# Patient Record
Sex: Female | Born: 1950 | Race: White | Hispanic: No | Marital: Single | Smoking: Never smoker
Health system: Southern US, Community
[De-identification: ages and names within clinical notes are randomized; demographics above are authoritative.]

## PROBLEM LIST (undated history)

## (undated) DIAGNOSIS — I10 Essential (primary) hypertension: Secondary | ICD-10-CM

## (undated) DIAGNOSIS — E669 Obesity, unspecified: Secondary | ICD-10-CM

## (undated) DIAGNOSIS — L309 Dermatitis, unspecified: Secondary | ICD-10-CM

## (undated) DIAGNOSIS — M1990 Unspecified osteoarthritis, unspecified site: Secondary | ICD-10-CM

## (undated) HISTORY — DX: Obesity, unspecified: E66.9

## (undated) HISTORY — DX: Essential (primary) hypertension: I10

## (undated) HISTORY — DX: Dermatitis, unspecified: L30.9

## (undated) HISTORY — DX: Unspecified osteoarthritis, unspecified site: M19.90

---

## 1983-07-20 HISTORY — PX: MYOMECTOMY: SHX85

## 1993-07-19 HISTORY — PX: TOTAL ABDOMINAL HYSTERECTOMY: SHX209

## 1996-07-19 HISTORY — PX: ANAL FISSURE REPAIR: SHX2312

## 1998-01-14 ENCOUNTER — Other Ambulatory Visit: Admission: RE | Admit: 1998-01-14 | Discharge: 1998-01-14 | Payer: Self-pay | Admitting: *Deleted

## 1998-07-14 ENCOUNTER — Ambulatory Visit (HOSPITAL_COMMUNITY): Admission: RE | Admit: 1998-07-14 | Discharge: 1998-07-14 | Payer: Self-pay | Admitting: General Surgery

## 1999-01-21 ENCOUNTER — Other Ambulatory Visit: Admission: RE | Admit: 1999-01-21 | Discharge: 1999-01-21 | Payer: Self-pay | Admitting: *Deleted

## 2001-06-19 ENCOUNTER — Other Ambulatory Visit: Admission: RE | Admit: 2001-06-19 | Discharge: 2001-06-19 | Payer: Self-pay | Admitting: *Deleted

## 2004-01-29 LAB — HM COLONOSCOPY

## 2005-02-09 ENCOUNTER — Ambulatory Visit: Payer: Self-pay | Admitting: Internal Medicine

## 2005-02-17 ENCOUNTER — Ambulatory Visit: Payer: Self-pay | Admitting: Internal Medicine

## 2005-03-23 ENCOUNTER — Ambulatory Visit: Payer: Self-pay | Admitting: Internal Medicine

## 2006-06-13 ENCOUNTER — Ambulatory Visit: Payer: Self-pay | Admitting: Internal Medicine

## 2006-06-13 LAB — CONVERTED CEMR LAB
Bilirubin Urine: NEGATIVE
CO2: 29 meq/L (ref 19–32)
Chloride: 99 meq/L (ref 96–112)
Creatinine, Ser: 0.9 mg/dL (ref 0.4–1.2)
GFR calc non Af Amer: 69 mL/min
Glomerular Filtration Rate, Af Am: 83 mL/min/{1.73_m2}
Glucose, Bld: 98 mg/dL (ref 70–99)
Ketones, ur: NEGATIVE mg/dL
LDL Cholesterol: 75 mg/dL (ref 0–99)
Leukocytes, UA: NEGATIVE
Lymphocytes Relative: 26.1 % (ref 12.0–46.0)
MCHC: 35.7 g/dL (ref 30.0–36.0)
MCV: 89.5 fL (ref 78.0–100.0)
Monocytes Absolute: 0.3 10*3/uL (ref 0.2–0.7)
Neutro Abs: 4.5 10*3/uL (ref 1.4–7.7)
Platelets: 318 10*3/uL (ref 150–400)
RBC: 3.88 M/uL (ref 3.87–5.11)
Sodium: 138 meq/L (ref 135–145)
Specific Gravity, Urine: 1.01 (ref 1.000–1.03)
TSH: 2.04 microintl units/mL (ref 0.35–5.50)
Total Protein, Urine: NEGATIVE mg/dL
Triglyceride fasting, serum: 141 mg/dL (ref 0–149)
Urine Glucose: NEGATIVE mg/dL
VLDL: 28 mg/dL (ref 0–40)
pH: 5.5 (ref 5.0–8.0)

## 2006-06-16 ENCOUNTER — Ambulatory Visit: Payer: Self-pay | Admitting: Internal Medicine

## 2007-06-20 ENCOUNTER — Encounter: Payer: Self-pay | Admitting: Internal Medicine

## 2007-06-20 DIAGNOSIS — Z9079 Acquired absence of other genital organ(s): Secondary | ICD-10-CM | POA: Insufficient documentation

## 2007-06-20 DIAGNOSIS — Z872 Personal history of diseases of the skin and subcutaneous tissue: Secondary | ICD-10-CM | POA: Insufficient documentation

## 2007-06-20 DIAGNOSIS — I10 Essential (primary) hypertension: Secondary | ICD-10-CM | POA: Insufficient documentation

## 2007-06-20 DIAGNOSIS — E669 Obesity, unspecified: Secondary | ICD-10-CM | POA: Insufficient documentation

## 2007-06-21 ENCOUNTER — Ambulatory Visit: Payer: Self-pay | Admitting: Internal Medicine

## 2007-06-21 LAB — CONVERTED CEMR LAB
BUN: 20 mg/dL (ref 6–23)
Basophils Absolute: 0.1 10*3/uL (ref 0.0–0.1)
Basophils Relative: 1.2 % — ABNORMAL HIGH (ref 0.0–1.0)
CO2: 30 meq/L (ref 19–32)
Calcium: 10 mg/dL (ref 8.4–10.5)
GFR calc Af Amer: 83 mL/min
Lymphocytes Relative: 26.5 % (ref 12.0–46.0)
MCHC: 35.4 g/dL (ref 30.0–36.0)
Monocytes Absolute: 0.4 10*3/uL (ref 0.2–0.7)
Monocytes Relative: 5.1 % (ref 3.0–11.0)
Neutro Abs: 5.1 10*3/uL (ref 1.4–7.7)
Platelets: 323 10*3/uL (ref 150–400)
Potassium: 3.7 meq/L (ref 3.5–5.1)
RDW: 16.1 % — ABNORMAL HIGH (ref 11.5–14.6)

## 2007-06-23 ENCOUNTER — Encounter: Payer: Self-pay | Admitting: Internal Medicine

## 2008-01-22 ENCOUNTER — Encounter: Payer: Self-pay | Admitting: Internal Medicine

## 2008-01-22 LAB — HM MAMMOGRAPHY: HM Mammogram: NORMAL

## 2008-02-21 ENCOUNTER — Ambulatory Visit: Payer: Self-pay | Admitting: Internal Medicine

## 2008-02-22 DIAGNOSIS — H8110 Benign paroxysmal vertigo, unspecified ear: Secondary | ICD-10-CM

## 2008-07-19 HISTORY — PX: ARTHROSCOPIC REPAIR ACL: SUR80

## 2009-01-24 ENCOUNTER — Encounter: Payer: Self-pay | Admitting: Internal Medicine

## 2009-01-27 ENCOUNTER — Ambulatory Visit: Payer: Self-pay | Admitting: Internal Medicine

## 2009-01-29 ENCOUNTER — Telehealth: Payer: Self-pay | Admitting: Internal Medicine

## 2009-01-29 ENCOUNTER — Ambulatory Visit: Payer: Self-pay | Admitting: Internal Medicine

## 2009-01-29 LAB — CONVERTED CEMR LAB
BUN: 18 mg/dL (ref 6–23)
Basophils Absolute: 0.1 10*3/uL (ref 0.0–0.1)
Basophils Relative: 1.1 % (ref 0.0–3.0)
Chloride: 101 meq/L (ref 96–112)
Cholesterol: 178 mg/dL (ref 0–200)
Eosinophils Absolute: 0.2 10*3/uL (ref 0.0–0.7)
GFR calc non Af Amer: 67.93 mL/min (ref >60)
HDL: 56.1 mg/dL (ref >39.00)
LDL Cholesterol: 97 mg/dL (ref 0–99)
Lymphocytes Relative: 31.1 % (ref 12.0–46.0)
MCHC: 35.9 g/dL (ref 30.0–36.0)
MCV: 92.2 fL (ref 78.0–100.0)
Monocytes Absolute: 0.4 10*3/uL (ref 0.1–1.0)
Neutrophils Relative %: 57.5 % (ref 43.0–77.0)
Platelets: 265 10*3/uL (ref 150.0–400.0)
Potassium: 3.8 meq/L (ref 3.5–5.1)
RDW: 15.8 % — ABNORMAL HIGH (ref 11.5–14.6)
Sodium: 142 meq/L (ref 135–145)
Triglycerides: 127 mg/dL (ref 0.0–149.0)
VLDL: 25.4 mg/dL (ref 0.0–40.0)

## 2009-02-01 ENCOUNTER — Encounter: Payer: Self-pay | Admitting: Internal Medicine

## 2010-01-27 ENCOUNTER — Encounter: Payer: Self-pay | Admitting: Internal Medicine

## 2010-12-04 NOTE — Assessment & Plan Note (Signed)
[facility]                           PRIMARY CARE OFFICE NOTE   NAME:[name], [name]                    MRN:          [id number]  DATE:06/16/2006                            DOB:          12/15/1950    [name] [name] is a 60 year old Caucasian woman who presents for followup  evaluation and exam.  She was last seen for a physical February 17, 2005.  She was seen in the office March 23, 2005, for shingles in the left  T12 distribution treated with Valtrex and Lidoderm patch.  She made a  good recovery from that and in the interval has done well.  She does  report she is having some problems with allergic rhinitis which started  with an upper respiratory infection.  She is treating this with over-the-  counter medications and does not wish prescription drugs at this time.   PAST MEDICAL HISTORY:  Surgical:  1. TAH/BSO in 1995.  2. Repair of anal fissure in 1998.   Medical:  1. Usual childhood diseases.  2. Obesity.  3. Hypertension.   CURRENT MEDICATIONS:  1. Lisinopril 20 mg daily.  2. Premarin 0.3 mg daily.  3. Hydrochlorothiazide 25 mg daily.  4. Aspirin 325 mg daily.  5. Triamcinolone 0.1% cream p.r.n.   PHYSICIAN ROSTER:  1. Dr. Earlene Plater for gynecology.  2. Dr. Arlyce Dice for GI.  3. Dr. Kendrick Ranch for surgery.   HEALTH MAINTENANCE:  Last Pap smear August 2007.  Last mammogram May  2007.  Last colonoscopy was in 2005.  The patient has declined DT and  influenza vaccine.   FAMILY HISTORY:  Is previously well documented.   SOCIAL HISTORY:  The patient continues to work as a Lawyer.  She remains single but active in her community and she is a self-  described homebody.   REVIEW OF SYSTEMS:  Significant for a 6-pound weight gain in the last  year but no fevers, chills or other problems.  Last eye exam greater  than 24 months.  No ENT, cardiovascular, or respiratory problems.  Occasional heartburn is reported, treated with OTC H2  blockers.  No GU,  musculoskeletal, or dermatologic complaints.   EXAMINATION:  VITAL SIGNS:  Temperature was 99.4, blood pressure 137/75,  pulse 87, weight 236, height is 5 feet 6 inches.  GENERAL APPEARANCE:  This is an overweight Caucasian woman who looks her  stated age, in no acute distress.  HEENT:  Normocephalic, atraumatic.  EACs and TMs were normal.  Oropharynx with native dentition in good repair.  No buccal or palatal  lesions were noted.  Posterior pharynx was clear.  Conjunctivae and  sclerae were clear.  PERRLA, EOMI.  Funduscopic exam was unremarkable.  NECK:  Supple without thyromegaly.  NODES:  No adenopathy was noted in the cervical, supraclavicular  regions.  CHEST:  No CVA tenderness.  LUNGS:  Clear with no rales, wheezes, or rhonchi.  BREAST:  Exam deferred to gynecology.  CARDIOVASCULAR:  Shows 2+ radial pulse, no JVD or carotid bruits.  She  had a quiet precordium with a regular rate and rhythm, without murmurs,  rubs, or gallops.  ABDOMEN:  Obese, soft.  No guarding or rebound.  No organosplenomegaly  was noted.  PELVIC AND RECTAL:  Deferred.  EXTREMITIES:  Without clubbing, cyanosis, edema, or deformity.  NEUROLOGIC:  Nonfocal.  SKIN:  Clear.   DATABASE:  Hemoglobin was 12.4 g, white count 6700 with a normal  differential.  Chemistries were unremarkable with a serum glucose of 98.  Liver functions, kidney functions were normal.  Cholesterol 167,  triglycerides 141, HDL 64.3, LDL 75.  Urinalysis was negative.   ASSESSMENT AND PLAN:  1. Hypertension.  The patient's blood pressure is adequately      controlled on low-dose hydrochlorothiazide and lisinopril.  Will      continue the same.  2. Health maintenance.  The patient is current and up-to-date with GYN      and colorectal cancer screening.   We did discuss weight management and I have encouraged her to keep a  food diary for 5 days with a calorie analysis.  I have encouraged her to  make smart food  choices and to reduce her portion size.  Ideal calorie  intake should be 1200-1300 calories per day.  Goal is to lose down to  200 pounds and the time frame would be 24-36 months.   In summary, a very pleasant person who seems to be medically stable.  I  would like to see her back in 1 year for followup or on a p.r.n. basis.     [name] Gess Norins, MD  Electronically Signed   MEN/MedQ  DD: 06/16/2006  DT: 06/17/2006  Job #: [id number]   cc:   [facility]

## 2011-01-25 ENCOUNTER — Telehealth: Payer: Self-pay | Admitting: *Deleted

## 2011-01-25 NOTE — Telephone Encounter (Signed)
Patient requesting RX RF before 7/26 OV. Unsure med

## 2011-01-27 NOTE — Telephone Encounter (Signed)
Left mess to call office back with details.

## 2011-01-29 MED ORDER — LISINOPRIL 20 MG PO TABS: 20.0000 mg | ORAL_TABLET | Freq: Every day | ORAL | Status: DC

## 2011-01-29 MED ORDER — HYDROCHLOROTHIAZIDE 25 MG PO TABS: 25.0000 mg | ORAL_TABLET | Freq: Every day | ORAL | Status: DC

## 2011-01-31 ENCOUNTER — Encounter: Payer: Self-pay | Admitting: Internal Medicine

## 2011-02-11 ENCOUNTER — Other Ambulatory Visit (INDEPENDENT_AMBULATORY_CARE_PROVIDER_SITE_OTHER): Payer: BC Managed Care – PPO

## 2011-02-11 ENCOUNTER — Ambulatory Visit (INDEPENDENT_AMBULATORY_CARE_PROVIDER_SITE_OTHER): Payer: BC Managed Care – PPO | Admitting: Internal Medicine

## 2011-02-11 ENCOUNTER — Encounter: Payer: Self-pay | Admitting: Internal Medicine

## 2011-02-11 VITALS — BP 138/82 | HR 88 | Temp 98.0°F | Wt 242.0 lb

## 2011-02-11 DIAGNOSIS — E669 Obesity, unspecified: Secondary | ICD-10-CM

## 2011-02-11 DIAGNOSIS — Z0000 Encounter for general adult medical examination without abnormal findings: Secondary | ICD-10-CM

## 2011-02-11 DIAGNOSIS — Z23 Encounter for immunization: Secondary | ICD-10-CM

## 2011-02-11 DIAGNOSIS — I10 Essential (primary) hypertension: Secondary | ICD-10-CM

## 2011-02-11 LAB — CBC WITH DIFFERENTIAL/PLATELET
Basophils Absolute: 0.1 10*3/uL (ref 0.0–0.1)
Eosinophils Relative: 5.3 % — ABNORMAL HIGH (ref 0.0–5.0)
HCT: 35.2 % — ABNORMAL LOW (ref 36.0–46.0)
Hemoglobin: 12.4 g/dL (ref 12.0–15.0)
Lymphocytes Relative: 22.2 % (ref 12.0–46.0)
Lymphs Abs: 1.6 10*3/uL (ref 0.7–4.0)
Monocytes Relative: 5.1 % (ref 3.0–12.0)
Platelets: 266 10*3/uL (ref 150.0–400.0)
RDW: 15.4 % — ABNORMAL HIGH (ref 11.5–14.6)
WBC: 7.3 10*3/uL (ref 4.5–10.5)

## 2011-02-11 LAB — HEPATIC FUNCTION PANEL
ALT: 39 U/L — ABNORMAL HIGH (ref 0–35)
AST: 35 U/L (ref 0–37)
Alkaline Phosphatase: 55 U/L (ref 39–117)
Bilirubin, Direct: 0.1 mg/dL (ref 0.0–0.3)
Total Bilirubin: 0.9 mg/dL (ref 0.3–1.2)
Total Protein: 8.2 g/dL (ref 6.0–8.3)

## 2011-02-11 LAB — COMPREHENSIVE METABOLIC PANEL
ALT: 39 U/L — ABNORMAL HIGH (ref 0–35)
AST: 35 U/L (ref 0–37)
Alkaline Phosphatase: 55 U/L (ref 39–117)
Calcium: 9.7 mg/dL (ref 8.4–10.5)
Chloride: 99 mEq/L (ref 96–112)
Creatinine, Ser: 1.1 mg/dL (ref 0.4–1.2)

## 2011-02-11 LAB — LIPID PANEL
Cholesterol: 186 mg/dL (ref 0–200)
HDL: 67 mg/dL (ref >39.00)
LDL Cholesterol: 90 mg/dL (ref 0–99)
VLDL: 29 mg/dL (ref 0.0–40.0)

## 2011-02-11 MED ORDER — TETANUS-DIPHTH-ACELL PERTUSSIS 5-2.5-18.5 LF-MCG/0.5 IM SUSP: 0.5000 mL | Freq: Once | INTRAMUSCULAR | Status: DC

## 2011-02-11 MED ORDER — TRIAMCINOLONE ACETONIDE 0.1 % EX CREA: 1.0000 "application " | TOPICAL_CREAM | CUTANEOUS | Status: DC | PRN

## 2011-02-11 MED ORDER — LISINOPRIL 20 MG PO TABS: 20.0000 mg | ORAL_TABLET | Freq: Every day | ORAL | Status: DC

## 2011-02-11 MED ORDER — PNEUMOCOCCAL VAC POLYVALENT 25 MCG/0.5ML IJ INJ: 0.5000 mL | INJECTION | Freq: Once | INTRAMUSCULAR | Status: DC

## 2011-02-11 MED ORDER — HYDROCHLOROTHIAZIDE 25 MG PO TABS: 25.0000 mg | ORAL_TABLET | Freq: Every day | ORAL | Status: DC

## 2011-02-11 NOTE — Progress Notes (Signed)
Subjective:    Patient ID: [name] [name], female    DOB: May 12, 1951, 60 y.o.   MRN: [id number]  HPI Mrs.Ching was last seen in July '10. In the interval she has had arthroscopic surgery right knee for a torn miniscus and debridement. She is s/p hysterectomy and does not see gyn. She had a mammogram last Thursday and had a liquid cyst and she will have recheck in 6 months. She has been doing well otherwise.   Past Medical History  Diagnosis Date  . Obesity   . Hypertension    Past Surgical History  Procedure Date  . Anal fissure repair 1998  . Total abdominal hysterectomy 1995   Family History  Problem Relation Age of Onset  . Coronary artery disease Mother     CABG  . Hypertension Mother   . Hyperlipidemia Mother   . Lupus Mother   . COPD Father   . Cancer Neg Hx     breast or colon   History   Social History  . Marital Status: Single    Spouse Name: N/A    Number of Children: N/A  . Years of Education: N/A   Occupational History  . 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 Smoking status: Never Smoker   . Smokeless tobacco: Never Used  . Alcohol Use: No  . Drug Use: No  . Sexually Active: Not Currently   Other Topics Concern  . Not on file   Social History Narrative   [employer], . Single, has own home. 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 End of Life care: yes for CPR and short term mechanical ventilation but no long term artificial support.        Review of Systems Review of Systems  Constitutional:  Negative for fever, chills, activity change and unexpected weight change.  HEENT:  Negative for hearing loss, ear pain, congestion, neck stiffness and postnasal drip. Negative for sore throat or swallowing problems. Negative for dental complaints.   Eyes: Negative for vision loss or change in visual acuity.  Respiratory: Negative for chest tightness and wheezing.   Cardiovascular: Negative for chest pain and  palpitation. No decreased exercise tolerance Gastrointestinal: No change in bowel habit. No bloating or gas. No reflux or indigestion Genitourinary: Negative for urgency, frequency, flank pain and difficulty urinating.  Musculoskeletal: Negative for myalgias, back pain, arthralgias and gait problem except for left shoulder discomfort.   Neurological: Negative for dizziness, tremors, weakness and headaches.  Hematological: Negative for adenopathy.  Psychiatric/Behavioral: Negative for behavioral problems and dysphoric mood.       Objective:   Physical Exam Vitals reviewed: weight noted Gen'l: well nourished, well developed white woman in no distress HEENT - [place]/AT, EACs/TMs normal, oropharynx with upper denture and remaining native teeth mandible, no buccal or palatal lesions, posterior pharynx clear, mucous membranes moist. C&S clear, PERRLA, fundi - normal Neck - supple, no thyromegaly Nodes- negative submental, cervical, supraclavicular regions Chest - no deformity, no CVAT Lungs - clear without rales, wheezes. No increased work of breathing Breast - deferred to recent mammogram Cardiovascular - regular rate and rhythm, quiet precordium, no murmurs, rubs or gallops, 2+ radial, DP and PT pulses Abdomen - BS+ x 4, no HSM, no guarding or rebound or tenderness Pelvic - deferred s/p hysterectomy Rectal - deferred to GI Extremities - no clubbing, cyanosis, edema or deformity.  Neuro - A&O x 3, CN II-XII normal, motor  strength normal and equal, DTRs 2+ and symmetrical biceps, radial, and patellar tendons. Cerebellar - no tremor, no rigidity, fluid movement and normal gait. Derm - Head, neck, back, abdomen and extremities without suspicious lesions  Lab Results  Component Value Date   WBC 7.3 02/11/2011   HGB 12.4 02/11/2011   HCT 35.2* 02/11/2011   PLT 266.0 02/11/2011   CHOL 186 02/11/2011   TRIG 145.0 02/11/2011   HDL 67.00 02/11/2011   ALT 39* 02/11/2011   ALT 39* 02/11/2011   AST 35  02/11/2011   AST 35 02/11/2011   NA 136 02/11/2011   K 3.9 02/11/2011   CL 99 02/11/2011   CREATININE 1.1 02/11/2011   BUN 31* 02/11/2011   CO2 26 02/11/2011   TSH 1.65 02/11/2011   Lab Results  Component Value Date   LDLCALC 90 02/11/2011           Assessment & Plan:

## 2011-02-12 ENCOUNTER — Encounter: Payer: Self-pay | Admitting: Internal Medicine

## 2011-02-12 DIAGNOSIS — Z0001 Encounter for general adult medical examination with abnormal findings: Secondary | ICD-10-CM | POA: Insufficient documentation

## 2011-02-12 NOTE — Assessment & Plan Note (Signed)
BP Readings from Last 3 Encounters:  02/11/11 138/82  01/27/09 118/64  02/21/08 129/75   Good control  Plan - continue present medication

## 2011-02-12 NOTE — Assessment & Plan Note (Signed)
This remains a major health risk.   Plan - continued efforts for weight management: smart food choices (low cal low fat), portion size control - use the hand as a guide, regular exercise. Target weight 180 lbs; goal is to loose 1 lb per month.

## 2011-02-12 NOTE — Assessment & Plan Note (Signed)
Interval history is benign. Physical exam is normal, sans breast and pelvic, except for weight. Lab results with minimal non-pathologic elevation in liver functions otherwise normal range. She is current with colorectal cancer screen with last study Juy '05. Immunizations: Tdap today; researching last pneumonia vaccine; she has had shingles in the past - not a candidate for shingles vaccine. Current with breast cancer screening.  In summary - a nice woman who appears medically stable.She will return as needed or in 1 year for follow-up.

## 2011-02-13 NOTE — Progress Notes (Signed)
Mailed/SD 

## 2011-02-17 ENCOUNTER — Encounter: Payer: Self-pay | Admitting: Internal Medicine

## 2011-03-16 ENCOUNTER — Encounter: Payer: Self-pay | Admitting: Gastroenterology

## 2011-07-20 HISTORY — PX: DIAGNOSTIC MAMMOGRAM: HXRAD719

## 2011-08-17 ENCOUNTER — Encounter: Payer: Self-pay | Admitting: Internal Medicine

## 2012-02-25 ENCOUNTER — Encounter: Payer: Self-pay | Admitting: Internal Medicine

## 2012-03-21 ENCOUNTER — Other Ambulatory Visit: Payer: Self-pay | Admitting: Internal Medicine

## 2012-07-11 ENCOUNTER — Other Ambulatory Visit: Payer: Self-pay | Admitting: Internal Medicine

## 2012-07-13 ENCOUNTER — Other Ambulatory Visit: Payer: Self-pay | Admitting: *Deleted

## 2012-07-13 MED ORDER — LISINOPRIL 20 MG PO TABS: 20.0000 mg | ORAL_TABLET | Freq: Every day | ORAL | Status: DC

## 2012-07-13 MED ORDER — HYDROCHLOROTHIAZIDE 25 MG PO TABS: 25.0000 mg | ORAL_TABLET | Freq: Every day | ORAL | Status: DC

## 2012-07-18 ENCOUNTER — Other Ambulatory Visit (INDEPENDENT_AMBULATORY_CARE_PROVIDER_SITE_OTHER): Payer: BC Managed Care – PPO

## 2012-07-18 ENCOUNTER — Ambulatory Visit (INDEPENDENT_AMBULATORY_CARE_PROVIDER_SITE_OTHER): Payer: BC Managed Care – PPO | Admitting: Internal Medicine

## 2012-07-18 ENCOUNTER — Encounter: Payer: Self-pay | Admitting: Internal Medicine

## 2012-07-18 VITALS — BP 140/84 | HR 97 | Temp 98.0°F | Resp 12 | Ht 69.0 in | Wt 250.0 lb

## 2012-07-18 DIAGNOSIS — I10 Essential (primary) hypertension: Secondary | ICD-10-CM

## 2012-07-18 DIAGNOSIS — Z0000 Encounter for general adult medical examination without abnormal findings: Secondary | ICD-10-CM

## 2012-07-18 DIAGNOSIS — Z23 Encounter for immunization: Secondary | ICD-10-CM

## 2012-07-18 DIAGNOSIS — E669 Obesity, unspecified: Secondary | ICD-10-CM

## 2012-07-18 LAB — COMPREHENSIVE METABOLIC PANEL
ALT: 45 U/L — ABNORMAL HIGH (ref 0–35)
CO2: 26 mEq/L (ref 19–32)
Calcium: 9.6 mg/dL (ref 8.4–10.5)
Chloride: 100 mEq/L (ref 96–112)
Creatinine, Ser: 1.2 mg/dL (ref 0.4–1.2)
GFR: 48.19 mL/min — ABNORMAL LOW (ref >60.00)
Glucose, Bld: 96 mg/dL (ref 70–99)
Total Bilirubin: 1 mg/dL (ref 0.3–1.2)
Total Protein: 7.7 g/dL (ref 6.0–8.3)

## 2012-07-18 NOTE — Patient Instructions (Addendum)
Thanks for coming to see me.  Your exam is ok except for the weight issue. Remember the principles of weight management: smart food choices, PORTION SIZE CONTROL, regular exercise. Goal is to loose 1-2 lbs/month to reach your target weight ( a long term project with rewards along the way.)  You will receive a copy of my full office note in several days that will include your labs as well as using MyChart  Have a Happy Healthy New Year.

## 2012-07-18 NOTE — Progress Notes (Signed)
Subjective:    Patient ID: [name] [name], female    DOB: 28-Oct-1950, 61 y.o.   MRN: [id number]  HPI Mr. Sprankle presents for annual wellness exam. She has been doing well except for chronic knee problems and stiffness; some discomfort in the right shoulder. No major illness or surgery. She has had some "heart flutters," w/o pain, SOB or discomfort. She has full dentures and doesn't need dental care. She is overdue for ophthal evaluation. She is aware of the need to address her weight.   Past Medical History  Diagnosis Date  . Obesity   . Hypertension    Past Surgical History  Procedure Date  . Anal fissure repair 1998  . Total abdominal hysterectomy 1995  . Arthroscopic repair acl 2010  . Diagnostic mammogram 2013   Family History  Problem Relation Age of Onset  . Coronary artery disease Mother     CABG  . Hypertension Mother   . Hyperlipidemia Mother   . Lupus Mother   . COPD Father   . Cancer Neg Hx     breast or colon   History   Social History  . Marital Status: Single    Spouse Name: N/A    Number of Children: N/A  . Years of Education: N/A   Occupational History  . 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 Smoking status: Never Smoker   . Smokeless tobacco: Never Used  . Alcohol Use: No  . Drug Use: No  . Sexually Active: Not Currently   Other Topics Concern  . Not on file   Social History Narrative   [employer], . Single, has own home. 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 End of Life care: yes for CPR and short term mechanical ventilation but no long term artificial support.     Current Outpatient Prescriptions on File Prior to Visit  Medication Sig Dispense Refill  . aspirin 81 MG tablet Take 81 mg by mouth daily.        . hydrochlorothiazide (HYDRODIURIL) 25 MG tablet Take 1 tablet (25 mg total) by mouth daily.  90 tablet  1  . lisinopril (PRINIVIL,ZESTRIL) 20 MG tablet Take 1 tablet (20 mg total) by  mouth daily.  90 tablet  1  . triamcinolone (KENALOG) 0.1 % cream Apply 1 application topically as needed.  90 g  1   Current Facility-Administered Medications on File Prior to Visit  Medication Dose Route Frequency Provider Last Rate Last Dose  . pneumococcal 23 valent vaccine (PNU-IMMUNE) injection 0.5 mL  0.5 mL Intramuscular Once Jacques Navy, MD      . TDaP (BOOSTRIX) injection 0.5 mL  0.5 mL Intramuscular Once Jacques Navy, MD          Review of Systems Constitutional:  Negative for fever, chills, activity change and unexpected weight change.  HEENT:  Negative for hearing loss, ear pain, congestion, neck stiffness and postnasal drip. Negative for sore throat or swallowing problems. Negative for dental complaints.   Eyes: Negative for vision loss or change in visual acuity.  Respiratory: Negative for chest tightness and wheezing. Negative for DOE.   Cardiovascular: Negative for chest pain. She did have palpitations. No decreased exercise tolerance Gastrointestinal: No change in bowel habit. No bloating or gas. No reflux or indigestion Genitourinary: Negative for urgency, frequency, flank pain and difficulty urinating.  Musculoskeletal: Negative for myalgias, back pain, and gait problem. Has arthralgia both  knees and right shoulder Neurological: Negative for dizziness, tremors, weakness and headaches.  Hematological: Negative for adenopathy.  Psychiatric/Behavioral: Negative for behavioral problems and dysphoric mood.       Objective:   Physical Exam Filed Vitals:   07/18/12 1005  BP: 140/84  Pulse: 97  Temp: 98 F (36.7 C)  Resp: 12   Wt Readings from Last 3 Encounters:  07/18/12 250 lb (113.399 kg)  02/11/11 242 lb (109.77 kg)  01/27/09 231 lb (104.781 kg)   Gen'l: well nourished, well developed white  Woman in no distress HEENT - [place]/AT, EACs/TMs normal, oropharynx with native dentition in good condition, no buccal or palatal lesions, posterior pharynx clear,  mucous membranes moist. C&S clear, PERRLA, fundi - normal Neck - supple, no thyromegaly Nodes- negative submental, cervical, supraclavicular regions Chest - no deformity, no CVAT Lungs - clear without rales, wheezes. No increased work of breathing Breast - deferred to recent mammogram Cardiovascular - regular rate and rhythm, quiet precordium, no murmurs, rubs or gallops, 2+ radial, DP and PT pulses Abdomen - BS+ x 4, no HSM, no guarding or rebound or tenderness Pelvic - deferred  Rectal - deferred  Extremities - no clubbing, cyanosis, edema or deformity.  Neuro - A&O x 3, CN II-XII normal, motor strength normal and equal, DTRs 2+ and symmetrical biceps, radial, and patellar tendons. Cerebellar - no tremor, no rigidity, fluid movement and normal gait. Derm - Head, neck, back, abdomen and extremities without suspicious lesions  Lab Results  Component Value Date   WBC 7.3 02/11/2011   HGB 12.4 02/11/2011   HCT 35.2* 02/11/2011   PLT 266.0 02/11/2011   GLUCOSE 96 07/18/2012   CHOL 186 02/11/2011   TRIG 145.0 02/11/2011   HDL 67.00 02/11/2011   LDLCALC 90 02/11/2011   ALT 45* 07/18/2012   AST 39* 07/18/2012   NA 136 07/18/2012   K 3.9 07/18/2012   CL 100 07/18/2012   CREATININE 1.2 07/18/2012   BUN 27* 07/18/2012   CO2 26 07/18/2012   TSH 1.65 02/11/2011          Assessment & Plan:

## 2012-07-19 NOTE — Assessment & Plan Note (Signed)
Reviewed the importance of weight management in regard to her overall health, including the problem of degenerative joint disease - knees. Discussed the principles of weight management: target weight 200 lbs, goal of loosing 1-2 lbs per month ( 2-4 year project).

## 2012-07-19 NOTE — Assessment & Plan Note (Signed)
Interval medical history is without major illness, surgery or injury. Phyiscal exam, sans breast and pelvic, is normal except for weight. Lab results are in normal range. She is current with colorectal and breast cancer screening. Immunizations - she is due for pneumonia and shingles vaccines.  In summary - a very nice woman who is doing OK. She will return in 1 year or sooner as needed.

## 2012-07-19 NOTE — Assessment & Plan Note (Signed)
BP Readings from Last 3 Encounters:  07/18/12 140/84  02/11/11 138/82  01/27/09 118/64   Lab results, renal function and electrolytes, are normal.  Plan Continue present medications  Weight management

## 2013-01-24 ENCOUNTER — Other Ambulatory Visit: Payer: Self-pay | Admitting: Internal Medicine

## 2013-02-16 ENCOUNTER — Encounter: Payer: Self-pay | Admitting: Internal Medicine

## 2013-08-20 ENCOUNTER — Other Ambulatory Visit: Payer: Self-pay | Admitting: Internal Medicine

## 2013-08-23 ENCOUNTER — Ambulatory Visit (INDEPENDENT_AMBULATORY_CARE_PROVIDER_SITE_OTHER): Payer: BC Managed Care – PPO | Admitting: Internal Medicine

## 2013-08-23 ENCOUNTER — Encounter: Payer: Self-pay | Admitting: Internal Medicine

## 2013-08-23 ENCOUNTER — Other Ambulatory Visit (INDEPENDENT_AMBULATORY_CARE_PROVIDER_SITE_OTHER): Payer: BC Managed Care – PPO

## 2013-08-23 VITALS — BP 160/90 | HR 84 | Temp 98.3°F | Wt 253.8 lb

## 2013-08-23 DIAGNOSIS — Z23 Encounter for immunization: Secondary | ICD-10-CM

## 2013-08-23 DIAGNOSIS — Z0000 Encounter for general adult medical examination without abnormal findings: Secondary | ICD-10-CM

## 2013-08-23 DIAGNOSIS — I10 Essential (primary) hypertension: Secondary | ICD-10-CM

## 2013-08-23 DIAGNOSIS — E669 Obesity, unspecified: Secondary | ICD-10-CM

## 2013-08-23 LAB — LIPID PANEL
CHOL/HDL RATIO: 3
Cholesterol: 174 mg/dL (ref 0–200)
HDL: 66.5 mg/dL (ref >39.00)
LDL CALC: 84 mg/dL (ref 0–99)
Triglycerides: 116 mg/dL (ref 0.0–149.0)
VLDL: 23.2 mg/dL (ref 0.0–40.0)

## 2013-08-23 LAB — HEMOGLOBIN AND HEMATOCRIT, BLOOD
HEMATOCRIT: 36.1 % (ref 36.0–46.0)
HEMOGLOBIN: 12.3 g/dL (ref 12.0–15.0)

## 2013-08-23 NOTE — Progress Notes (Signed)
Subjective:    Patient ID: [name] [name], female    DOB: 1950/09/19, 63 y.o.   MRN: [id number]  HPI Mrs. Ostermiller presents for an annual wellness exam. She is s/p TAH/BSO. She is due July '15 for colonoscopy. Discussed colonoscopy vs Colo-Guard screening - she is leaning toward colonoscopy but she will get back to [place]. She is current with mammography. She does continue to have knee pain and right knee is approaching TKR.  She has full dentures. She has had eye care. She follows a reasonable diet. She is not exercising on a regular basis.  Her weight is stable but she is still overweight.   Past Medical History  Diagnosis Date  . Obesity   . Hypertension    Past Surgical History  Procedure Laterality Date  . Anal fissure repair  1998  . Total abdominal hysterectomy  1995  . Arthroscopic repair acl  2010  . Diagnostic mammogram  2013   Family History  Problem Relation Age of Onset  . Coronary artery disease Mother     CABG  . Hypertension Mother   . Hyperlipidemia Mother   . Lupus Mother   . COPD Father   . Cancer Neg Hx     breast or colon   History   Social History  . Marital Status: Single    Spouse Name: N/A    Number of Children: N/A  . Years of Education: N/A   Occupational History  . 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 Smoking status: Never Smoker   . Smokeless tobacco: Never Used  . Alcohol Use: No  . Drug Use: No  . Sexual Activity: Not Currently   Other Topics Concern  . Not on file   Social History Narrative   [employer], . Single, has own home. 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 End of Life care: yes for CPR and short term mechanical ventilation but no long term artificial support.     Current Outpatient Prescriptions on File Prior to Visit  Medication Sig Dispense Refill  . aspirin 81 MG tablet Take 81 mg by mouth daily.        . hydrochlorothiazide (HYDRODIURIL) 25 MG tablet TAKE ONE TABLET BY  MOUTH ONCE DAILY  90 tablet  0  . lisinopril (PRINIVIL,ZESTRIL) 20 MG tablet TAKE ONE TABLET BY MOUTH ONCE DAILY  90 tablet  0  . naproxen sodium (ANAPROX) 220 MG tablet Take 220 mg by mouth every morning.      . triamcinolone (KENALOG) 0.1 % cream Apply 1 application topically as needed.  90 g  1   Current Facility-Administered Medications on File Prior to Visit  Medication Dose Route Frequency Provider Last Rate Last Dose  . pneumococcal 23 valent vaccine (PNU-IMMUNE) injection 0.5 mL  0.5 mL Intramuscular Once Jacques Navy, MD      . TDaP (BOOSTRIX) injection 0.5 mL  0.5 mL Intramuscular Once Jacques Navy, MD          Review of Systems Constitutional:  Negative for fever, chills, activity change and unexpected weight change.  HEENT:  Negative for hearing loss - but it is harder to hear when in a crowd, ear pain, congestion, neck stiffness and postnasal drip. Negative for sore throat or swallowing problems. Negative for dental complaints.   Eyes: Negative for vision loss or change in visual acuity.  Respiratory: Negative for chest tightness and wheezing. Negative  for DOE.   Cardiovascular: Negative for chest pain or palpitations. No decreased exercise tolerance Gastrointestinal: No change in bowel habit. No bloating or gas. No reflux or indigestion Genitourinary: Negative for urgency, frequency, flank pain and difficulty urinating.  Musculoskeletal: Negative for myalgias, back pain, arthralgias and gait problem.  Neurological: Negative for dizziness, tremors, weakness and headaches.  Hematological: Negative for adenopathy.  Psychiatric/Behavioral: Negative for behavioral problems and dysphoric mood.       Objective:   Physical Exam Filed Vitals:   08/23/13 1425  BP: 160/90  Pulse: 84  Temp: 98.3 F (36.8 C)   Wt Readings from Last 3 Encounters:  08/23/13 253 lb 12.8 oz (115.123 kg)  07/18/12 250 lb (113.399 kg)  02/11/11 242 lb (109.77 kg)   Gen'l: well nourished,  well developed     Woman in no distress HEENT - [place]/AT, EACs/TMs normal, oropharynx with native dentition in good condition, no buccal or palatal lesions, posterior pharynx clear, mucous membranes moist. C&S clear, PERRLA, fundi - normal Neck - supple, no thyromegaly Nodes- negative submental, cervical, supraclavicular regions Chest - no deformity, no CVAT Lungs - cleat without rales, wheezes. No increased work of breathing Breast - Cardiovascular - regular rate and rhythm, quiet precordium, no murmurs, rubs or gallops, 2+ radial, DP and PT pulses Abdomen - BS+ x 4, no HSM, no guarding or rebound or tenderness Pelvic - deferred to gyn Rectal - deferred to gyn Extremities - no clubbing, cyanosis, edema or deformity.  Neuro - A&O x 3, CN II-XII normal, motor strength normal and equal, DTRs 2+ and symmetrical biceps, radial, and patellar tendons. Cerebellar - no tremor, no rigidity, fluid movement and normal gait. Derm - Head, neck, back, abdomen and extremities without suspicious lesions  Recent Results (from the past 2160 hour(s))  LIPID PANEL     Status: None   Collection Time    08/23/13  3:27 PM      Result Value Range   Cholesterol 174  0 - 200 mg/dL   Comment: ATP III Classification       Desirable:  < 200 mg/dL               Borderline High:  200 - 239 mg/dL          High:  > = 161240 mg/dL   Triglycerides 096.0116.0  0.0 - 149.0 mg/dL   Comment: Normal:  <454<150 mg/dLBorderline High:  150 - 199 mg/dL   HDL 09.8166.50  >19.14>39.00 mg/dL   VLDL 78.223.2  0.0 - 95.640.0 mg/dL   LDL Cholesterol 84  0 - 99 mg/dL   Total CHOL/HDL Ratio 3     Comment:                Men          Women1/2 Average Risk     3.4          3.3Average Risk          5.0          4.42X Average Risk          9.6          7.13X Average Risk          15.0          11.0                      HEMOGLOBIN AND HEMATOCRIT, BLOOD     Status: None   Collection Time  08/23/13  3:27 PM      Result Value Range   Hemoglobin 12.3  12.0 - 15.0 g/dL   HCT  16.1  09.6 - 04.5 %          Assessment & Plan:

## 2013-08-23 NOTE — Patient Instructions (Signed)
Thanks for coming in to see me. As I retire I will refer you to Dr. Oliver BarreJames John to be your primary care doctor.  Your physical exam is ok except for weight. Labs are ordered and results will be posted to MyChart  Immunizations - flu shot today; may return in 2 weeks for either shingles vaccine or Prevnar pneumonia vaccine with the other one 4-6 weeks later.  Weight management is so important: Diet management: smart food choices, PORTION SIZE CONTROL, regular exercise. Goal - to loose 1-2 lbs.month. Target weight - 200 lbs ( 53.8 lb a 4 year project.)

## 2013-08-23 NOTE — Progress Notes (Signed)
Pre visit review using our clinic review tool, if applicable. No additional management support is needed unless otherwise documented below in the visit note. 

## 2013-08-24 ENCOUNTER — Encounter: Payer: Self-pay | Admitting: Internal Medicine

## 2013-08-26 NOTE — Assessment & Plan Note (Signed)
BP Readings from Last 3 Encounters:  08/23/13 160/90  07/18/12 140/84  02/11/11 138/82   Generally well controlled but elevated at today's visit.  Plan Continue present medications  Check BP at home or work and report back, especially if SBP consistently greater than 150.

## 2013-08-26 NOTE — Assessment & Plan Note (Addendum)
Discussed with her the importance of weight management to her overall health.  Plan Diet management: smart food choices, PORTION SIZE CONTROL, regular exercise. Goal - to loose 1-2 lbs.month. Target weight - 200 lb s ( 4 year project.)

## 2013-08-26 NOTE — Assessment & Plan Note (Signed)
Interval history is benign. Limited physical exam is normal. Basic lab and H/H, lipid panel - normal. He is due this year for colorectal cancer screening - discussed Colo-Guard as alternative. She will move ahead with follow up colonoscopy. She is current with mammography for breast cancer screening.  Immunizations are OK - she is due for prevnar.   In summar  A pleasant retired woman who appears to be medically stable.

## 2013-08-27 ENCOUNTER — Encounter: Payer: Self-pay | Admitting: Internal Medicine

## 2013-09-21 ENCOUNTER — Encounter: Payer: Self-pay | Admitting: Internal Medicine

## 2013-10-30 ENCOUNTER — Encounter: Payer: Self-pay | Admitting: Gastroenterology

## 2013-12-05 ENCOUNTER — Other Ambulatory Visit: Payer: Self-pay

## 2013-12-05 MED ORDER — LISINOPRIL 20 MG PO TABS: 20.0000 mg | ORAL_TABLET | Freq: Every day | ORAL | Status: DC

## 2013-12-05 MED ORDER — HYDROCHLOROTHIAZIDE 25 MG PO TABS: 25.0000 mg | ORAL_TABLET | Freq: Every day | ORAL | Status: DC

## 2014-01-01 ENCOUNTER — Ambulatory Visit (INDEPENDENT_AMBULATORY_CARE_PROVIDER_SITE_OTHER): Payer: BC Managed Care – PPO | Admitting: *Deleted

## 2014-01-01 DIAGNOSIS — Z23 Encounter for immunization: Secondary | ICD-10-CM

## 2014-01-15 ENCOUNTER — Ambulatory Visit (INDEPENDENT_AMBULATORY_CARE_PROVIDER_SITE_OTHER): Payer: BC Managed Care – PPO | Admitting: *Deleted

## 2014-01-15 DIAGNOSIS — Z23 Encounter for immunization: Secondary | ICD-10-CM

## 2014-01-30 ENCOUNTER — Ambulatory Visit (INDEPENDENT_AMBULATORY_CARE_PROVIDER_SITE_OTHER): Payer: BC Managed Care – PPO | Admitting: Family Medicine

## 2014-01-30 VITALS — BP 134/76 | HR 96 | Temp 97.7°F | Resp 18 | Ht 67.75 in | Wt 243.0 lb

## 2014-01-30 DIAGNOSIS — M109 Gout, unspecified: Secondary | ICD-10-CM

## 2014-01-30 LAB — CBC
HEMATOCRIT: 35.2 % — AB (ref 36.0–46.0)
HEMOGLOBIN: 12.3 g/dL (ref 12.0–15.0)
MCH: 32.4 pg (ref 26.0–34.0)
MCHC: 34.9 g/dL (ref 30.0–36.0)
MCV: 92.6 fL (ref 78.0–100.0)
Platelets: 253 10*3/uL (ref 150–400)
RBC: 3.8 MIL/uL — ABNORMAL LOW (ref 3.87–5.11)
RDW: 14.8 % (ref 11.5–15.5)
WBC: 7.8 10*3/uL (ref 4.0–10.5)

## 2014-01-30 LAB — URIC ACID: URIC ACID, SERUM: 9.4 mg/dL — AB (ref 2.4–7.0)

## 2014-01-30 LAB — POCT SEDIMENTATION RATE: POCT SED RATE: 49 mm/h — AB (ref 0–22)

## 2014-01-30 MED ORDER — INDOMETHACIN 50 MG PO CAPS: 50.0000 mg | ORAL_CAPSULE | Freq: Three times a day (TID) | ORAL | Status: DC

## 2014-01-30 NOTE — Patient Instructions (Signed)
Gout Gout is an inflammatory arthritis caused by a buildup of uric acid crystals in the joints. Uric acid is a chemical that is normally present in the blood. When the level of uric acid in the blood is too high it can form crystals that deposit in your joints and tissues. This causes joint redness, soreness, and swelling (inflammation). Repeat attacks are common. Over time, uric acid crystals can form into masses (tophi) near a joint, destroying bone and causing disfigurement. Gout is treatable and often preventable. CAUSES  The disease begins with elevated levels of uric acid in the blood. Uric acid is produced by your body when it breaks down a naturally found substance called purines. Certain foods you eat, such as meats and fish, contain high amounts of purines. Causes of an elevated uric acid level include:  Being passed down from parent to child (heredity).  Diseases that cause increased uric acid production (such as obesity, psoriasis, and certain cancers).  Excessive alcohol use.  Diet, especially diets rich in meat and seafood.  Medicines, including certain cancer-fighting medicines (chemotherapy), water pills (diuretics), and aspirin.  Chronic kidney disease. The kidneys are no longer able to remove uric acid well.  Problems with metabolism. Conditions strongly associated with gout include:  Obesity.  High blood pressure.  High cholesterol.  Diabetes. Not everyone with elevated uric acid levels gets gout. It is not understood why some people get gout and others do not. Surgery, joint injury, and eating too much of certain foods are some of the factors that can lead to gout attacks. SYMPTOMS   An attack of gout comes on quickly. It causes intense pain with redness, swelling, and warmth in a joint.  Fever can occur.  Often, only one joint is involved. Certain joints are more commonly involved:  Base of the big toe.  Knee.  Ankle.  Wrist.  Finger. Without  treatment, an attack usually goes away in a few days to weeks. Between attacks, you usually will not have symptoms, which is different from many other forms of arthritis. DIAGNOSIS  Your caregiver will suspect gout based on your symptoms and exam. In some cases, tests may be recommended. The tests may include:  Blood tests.  Urine tests.  X-rays.  Joint fluid exam. This exam requires a needle to remove fluid from the joint (arthrocentesis). Using a microscope, gout is confirmed when uric acid crystals are seen in the joint fluid. TREATMENT  There are two phases to gout treatment: treating the sudden onset (acute) attack and preventing attacks (prophylaxis).  Treatment of an Acute Attack.  Medicines are used. These include anti-inflammatory medicines or steroid medicines.  An injection of steroid medicine into the affected joint is sometimes necessary.  The painful joint is rested. Movement can worsen the arthritis.  You may use warm or cold treatments on painful joints, depending which works best for you.  Treatment to Prevent Attacks.  If you suffer from frequent gout attacks, your caregiver may advise preventive medicine. These medicines are started after the acute attack subsides. These medicines either help your kidneys eliminate uric acid from your body or decrease your uric acid production. You may need to stay on these medicines for a very long time.  The early phase of treatment with preventive medicine can be associated with an increase in acute gout attacks. For this reason, during the first few months of treatment, your caregiver may also advise you to take medicines usually used for acute gout treatment. Be sure you   understand your caregiver's directions. Your caregiver may make several adjustments to your medicine dose before these medicines are effective.  Discuss dietary treatment with your caregiver or dietitian. Alcohol and drinks high in sugar and fructose and foods  such as meat, poultry, and seafood can increase uric acid levels. Your caregiver or dietician can advise you on drinks and foods that should be limited. HOME CARE INSTRUCTIONS   Do not take aspirin to relieve pain. This raises uric acid levels.  Only take over-the-counter or prescription medicines for pain, discomfort, or fever as directed by your caregiver.  Rest the joint as much as possible. When in bed, keep sheets and blankets off painful areas.  Keep the affected joint raised (elevated).  Apply warm or cold treatments to painful joints. Use of warm or cold treatments depends on which works best for you.  Use crutches if the painful joint is in your leg.  Drink enough fluids to keep your urine clear or pale yellow. This helps your body get rid of uric acid. Limit alcohol, sugary drinks, and fructose drinks.  Follow your dietary instructions. Pay careful attention to the amount of protein you eat. Your daily diet should emphasize fruits, vegetables, whole grains, and fat-free or low-fat milk products. Discuss the use of coffee, vitamin C, and cherries with your caregiver or dietician. These may be helpful in lowering uric acid levels.  Maintain a healthy body weight. SEEK MEDICAL CARE IF:   You develop diarrhea, vomiting, or any side effects from medicines.  You do not feel better in 24 hours, or you are getting worse. SEEK IMMEDIATE MEDICAL CARE IF:   Your joint becomes suddenly more tender, and you have chills or a fever. MAKE SURE YOU:   Understand these instructions.  Will watch your condition.  Will get help right away if you are not doing well or get worse. Document Released: 07/02/2000 Document Revised: 10/30/2012 Document Reviewed: 02/16/2012 [facility] Patient Information 2015 [employer], [place]. This information is not intended to replace advice given to you by your health care provider. Make sure you discuss any questions you have with your health care  provider. Information for patients with Gout  Gout defined-Gout occurs when urate crystals accumulate in your joint causing the inflammation and intense pain of gout attack.  Urate crystals can form when you have high levels of uric acid in your blood.  Your body produces uric acid when it breaks down prurines-substances that are found naturally in your body, as well as in certain foods such as organ meats, anchioves, herring, asparagus, and mushrooms.  Normally uric acid dissolves in your blood and passes through your kidneys into your urine.  But sometimes your body either produces too much uric acid or your kidneys excrete too little uric acid.  When this happens, uric acid can build up, forming sharp needle-like urate crystals in a joint or surrounding tissue that cause pain, inflammation and swelling.    Gout is characterized by sudden, severe attacks of pain, redness and tenderness in joints, often the joint at the base of the big toe.  Gout is complex form of arthritis that can affect anyone.  Men are more likely to get gout but women become increasingly more susceptible to gout after menopause.  An acute attack of gout can wake you up in the middle of the night with the sensation that your big toe is on fire.  The affected joint is hot, swollen and so tender that even the weight or the  sheet on it may seem intolerable.  If you experience symptoms of an acute gout attack it is important to your doctor as soon as the symptoms start.  Gout that goes untreated can lead to worsening pain and joint damage.  Risk Factors:  You are more likely to develop gout if you have high levels of uric acid in your body.    Factors that increase the uric acid level in your body include:  Lifestyle factors.  Excessive alcohol use-generally more than two drinks a day for men and more than one for women increase the risk of gout.  Medical conditions.  Certain conditions make it more likely that you will  develop gout.  These include hypertension, and chronic conditions such as diabetes, high levels of fat and cholesterol in the blood, and narrowing of the arteries.  Certain medications.  The uses of Thiazide diuretics- commonly used to treat hypertension and low dose aspirin can also increase uric acid levels.  Family history of gout.  If other members of your family have had gout, you are more likely to develop the disease.  Age and sex. Gout occurs more often in men than it does in women, primarily because women tend to have lower uric acid levels than men do.  Men are more likely to develop gout earlier usually between the ages of 89-50- whereas women generally develop signs and symptoms after menopause.    Tests and diagnosis:  Tests to help diagnose gout may include:  Blood test.  Your doctor may recommend a blood test to measure the uric acid level in your blood .  Blood tests can be misleading, though.  Some people have high uric acid levels but never experience gout.  And some people have signs and symptoms of gout, but don't have unusual levels of uric acid in their blood.  Joint fluid test.  Your doctor may use a needle to draw fluid from your affected joint.  When examined under the microscope, your joint fluid may reveal urate crystals.  Treatment:  Treatment for gout usually involves medications.  What medications you and your doctor choose will be based on your current health and other medications you currently take.  Gout medications can be used to treat acute gout attacks and prevent future attacks as well as reduce your risk of complications from gout such as the development of tophi from urate crystal deposits.  Alternative medicine:   Certain foods have been studied for their potential to lower uric acid levels, including:  Coffee.  Studies have found an association between coffee drinking (regular and decaf) and lower uric acid levels.  The evidence is not enough to  encourage non-coffee drinkers to start, but it may give clues to new ways of treating gout in the future.  Vitamin C.  Supplements containing vitamin C may reduce the levels of uric acid in your blood.  However, vitamin as a treatment for gout. Don't assume that if a little vitamin C is good, than lots is better.  Megadoses of vitamin C may increase your bodies uric acid levels.  Cherries.  Cherries have been associated with lower levels of uric acid in studies, but it isn't clear if they have any effect on gout signs and symptoms.  Eating more cherries and other dar-colored fruits, such as blackberries, blueberries, purple grapes and raspberries, may be a safe way to support your gout treatment.    Lifestyle/Diet Recommendations:   Drink 8 to 16 cups ( about 2  to 4 liters) of fluid each day, with at least half being water.  Avoid alcohol  Eat a moderate amount of protein, preferably from healthy sources, such as low-fat or fat-free dairy, tofu, eggs, and nut butters.  Limit you daily intake of meat, fish, and poultry to 4 to 6 ounces.  Avoid high fat meats and desserts.  Decrease you intake of shellfish, beef, lamb, pork, eggs and cheese.  Choose a good source of vitamin C daily such as citrus fruits, strawberries, broccoli,  brussel sprouts, papaya, and cantaloupe.   Choose a good source of vitamin A every other day such as yellow fruits, or dark green/yellow vegetables.  Avoid drastic weigh reduction or fasting.  If weigh loss is desired lose it over a period of several months.  See "dietary considerations.." chart for specific food recommendations.  Dietary Considerations for people with Gout  Food with negligible purine content (0-15 mg of purine nitrogen per 100 grams food)  May use as desired except on calorie variations  Non fat milk Cocoa Cereals (except in list II) Hard candies  Buttermilk Carbonated drinks Vegetables (except in list II) Sherbet  Coffee Fruits Sugar Honey   Tea Cottage Cheese Gelatin-jell-o Salt  Fruit juice Breads Angel food Cake   Herbs/spices Jams/Jellies [employer]    Foods that do not contain excessive purine content, but must be limited due to fat content  Cream Eggs Oil and Salad Dressing  Half and Half Peanut Butter Chocolate  Whole Milk Cakes Potato Chips  Butter Ice Cream Fried Foods  Cheese Nuts Waffles, pancakes   List II: Food with moderate purine content (50-150 mg of purine nitrogen per 100 grams of food)  Limit total amount each day to 5 oz. cooked Lean meat, other than those on list III   Poultry, other than those on list III Fish, other than those on list III   Seafood, other than those on list III  These foods may be used occasionally  Peas Lentils Bran  Spinach Oatmeal Dried Beans and Peas  Asparagus Wheat Germ Mushrooms   Additional information about meat choices  Choose fish and poultry, particularly without skin, often.  Select lean, well trimmed cuts of meat.  Avoid all fatty meats, bacon , sausage, fried meats, fried fish, or poultry, luncheon meats, cold cuts, hot dogs, meats canned or frozen in gravy, spareribs and frozen and packaged prepared meats.   List III: Foods with HIGH purine content / Foods to AVOID (150-800 mg of purine nitrogen per 100 grams of food)  Anchovies Herring Meat Broths  Liver Mackerel Meat Extracts  Kidney Scallops Meat Drippings  Sardines Wild Game Mincemeat  Sweetbreads Goose Gravy  Heart Tongue Yeast, baker's and brewers   Commercial soups made with any of the foods listed in List II or List III  In addition avoid all alcoholic beverages

## 2014-01-30 NOTE — Progress Notes (Signed)
Subjective:    Patient ID: [name] [name], female    DOB: [birth year], 63 y.o.   MRN: [id number] Chief Complaint  Patient presents with  . Foot Pain    Left, swelling started 2-3 days ago, X Friday morning    HPI  On Friday morning when she awoke she felt like she had a cramp in her toe.  However, pain has continued intermittently - shooting pains - but now pain is getting better but is having trouble walking and swelling is present.  She is able to wiggle her toes a little now but wasn't able to for a few days.  Cannot put a shoe on due to tenderness and swelling. Slightly red  No h/o gout or any prior incidence of anything like this. No known injury.  Trying not to touch it. Has been icing occ - about twice a day at night for about 30 min. Has been using aleve - takes 1 a day chronically for knees but has increased to 2 tabs a day. Walking with cane and trying to stay off of foot and elevating it.  Past Medical History  Diagnosis Date  . Obesity   . Hypertension   . Arthritis    Current Outpatient Prescriptions on File Prior to Visit  Medication Sig Dispense Refill  . aspirin 81 MG tablet Take 81 mg by mouth daily.        . hydrochlorothiazide (HYDRODIURIL) 25 MG tablet Take 1 tablet (25 mg total) by mouth daily.  90 tablet  1  . lisinopril (PRINIVIL,ZESTRIL) 20 MG tablet Take 1 tablet (20 mg total) by mouth daily.  90 tablet  1  . naproxen sodium (ANAPROX) 220 MG tablet Take 220 mg by mouth every morning.      . triamcinolone (KENALOG) 0.1 % cream Apply 1 application topically as needed.  90 g  1   Current Facility-Administered Medications on File Prior to Visit  Medication Dose Route Frequency Provider Last Rate Last Dose  . pneumococcal 23 valent vaccine (PNU-IMMUNE) injection 0.5 mL  0.5 mL Intramuscular Once Jacques Navy, MD      . TDaP (BOOSTRIX) injection 0.5 mL  0.5 mL Intramuscular Once Jacques Navy, MD       No Known Allergies   Review of Systems    Constitutional: Positive for activity change. Negative for fever, chills and unexpected weight change.  Musculoskeletal: Positive for arthralgias, gait problem, joint swelling and myalgias. Negative for back pain.  Skin: Positive for color change. Negative for pallor, rash and wound.  Neurological: Negative for weakness and numbness.  Psychiatric/Behavioral: Positive for sleep disturbance.      BP 134/76  Pulse 96  Temp(Src) 97.7 F (36.5 C) (Oral)  Resp 18  Ht 5' 7.75" (1.721 m)  Wt 243 lb (110.224 kg)  BMI 37.21 kg/m2  SpO2 95% Objective:   Physical Exam  Constitutional: She is oriented to person, place, and time. She appears well-developed and well-nourished. No distress.  HENT:  Head: Normocephalic and atraumatic.  Right Ear: External ear normal.  Eyes: Conjunctivae are normal. No scleral icterus.  Pulmonary/Chest: Effort normal.  Musculoskeletal:       Left foot: She exhibits decreased range of motion, tenderness, bony tenderness and swelling. She exhibits normal capillary refill.  Neurological: She is alert and oriented to person, place, and time.  Skin: Skin is warm and dry. She is not diaphoretic. No erythema.  Psychiatric: She has a normal mood and affect. Her behavior is  normal.          Assessment & Plan:  Pt informed that if not getting relief in 24 hrs of indomethacin, ok to call in some prednisone.  Acute gout of left foot, unspecified cause - Plan: CBC, POCT SEDIMENTATION RATE, Uric acid  Meds ordered this encounter  Medications  . indomethacin (INDOCIN) 50 MG capsule    Sig: Take 1 capsule (50 mg total) by mouth 3 (three) times daily with meals.    Dispense:  30 capsule    Refill:  1    Norberto SorensonEva Reggie Bise, MD MPH

## 2014-01-31 ENCOUNTER — Encounter: Payer: Self-pay | Admitting: Family Medicine

## 2014-01-31 ENCOUNTER — Telehealth: Payer: Self-pay

## 2014-01-31 MED ORDER — PREDNISONE 10 MG PO TABS: ORAL_TABLET | ORAL | Status: DC

## 2014-01-31 NOTE — Telephone Encounter (Signed)
Dizzy from indocin? - very surprising reaction as it is similar aleve.  However, I am happy to send her in a prednisone taper that she can use instead.  Message sent to lab pool that labs confirm gout.

## 2014-01-31 NOTE — Telephone Encounter (Signed)
Tried to call pt, no answer no VM.

## 2014-01-31 NOTE — Telephone Encounter (Signed)
Dr Clelia CroftShaw,    Patient is dizzy from the medication prescribed for her toe.   She has not taken any today, as she is still dizzy from last nights medication.  Please advise.   [phone]

## 2014-02-02 NOTE — Telephone Encounter (Signed)
Spoke with pt. She picked up the prednisone and stopped the Indocin. She is feeling much better.

## 2014-02-02 NOTE — Telephone Encounter (Signed)
Left message on machine to call back  

## 2014-02-14 ENCOUNTER — Encounter: Payer: Self-pay | Admitting: Gastroenterology

## 2014-04-02 ENCOUNTER — Encounter: Payer: Self-pay | Admitting: Internal Medicine

## 2014-04-10 ENCOUNTER — Ambulatory Visit (AMBULATORY_SURGERY_CENTER): Payer: Self-pay | Admitting: *Deleted

## 2014-04-10 VITALS — Ht 68.0 in | Wt 246.0 lb

## 2014-04-10 DIAGNOSIS — Z1211 Encounter for screening for malignant neoplasm of colon: Secondary | ICD-10-CM

## 2014-04-10 MED ORDER — NA SULFATE-K SULFATE-MG SULF 17.5-3.13-1.6 GM/177ML PO SOLN: ORAL | Status: DC

## 2014-04-10 NOTE — Progress Notes (Signed)
No allergies to eggs or soy. No problems with anesthesia.  Pt given Emmi instructions for colonoscopy  No oxygen use  No diet drug use  

## 2014-04-24 ENCOUNTER — Ambulatory Visit (AMBULATORY_SURGERY_CENTER): Payer: Medicare PPO | Admitting: Gastroenterology

## 2014-04-24 ENCOUNTER — Encounter: Payer: Self-pay | Admitting: Gastroenterology

## 2014-04-24 VITALS — BP 111/70 | HR 86 | Temp 97.9°F | Resp 25 | Ht 68.0 in | Wt 246.0 lb

## 2014-04-24 DIAGNOSIS — Z1211 Encounter for screening for malignant neoplasm of colon: Secondary | ICD-10-CM

## 2014-04-24 MED ORDER — SODIUM CHLORIDE 0.9 % IV SOLN: 500.0000 mL | INTRAVENOUS | Status: DC

## 2014-04-24 NOTE — Progress Notes (Signed)
Pt has been on the monitor for 30 mins and unable to pass flatus.  Pt rolled side to side, trendelenberg and levsin sl given.  Pt requested to go to the rest room and sit on the stool.  maw

## 2014-04-24 NOTE — Progress Notes (Signed)
A/ox3 pleased with MAC, report to Annette RN 

## 2014-04-24 NOTE — Patient Instructions (Addendum)
YOU HAD AN ENDOSCOPIC PROCEDURE TODAY AT THE [facility]: Refer to the procedure report that was given to you for any specific questions about what was found during the examination.  If the procedure report does not answer your questions, please call your gastroenterologist to clarify.  If you requested that your care partner not be given the details of your procedure findings, then the procedure report has been included in a sealed envelope for you to review at your convenience later.  YOU SHOULD EXPECT: Some feelings of bloating in the abdomen. Passage of more gas than usual.  Walking can help get rid of the air that was put into your GI tract during the procedure and reduce the bloating. If you had a lower endoscopy (such as a colonoscopy or flexible sigmoidoscopy) you may notice spotting of blood in your stool or on the toilet paper. If you underwent a bowel prep for your procedure, then you may not have a normal bowel movement for a few days.  DIET: Your first meal following the procedure should be a light meal and then it is ok to progress to your normal diet.  A half-sandwich or bowl of soup is an example of a good first meal.  Heavy or fried foods are harder to digest and may make you feel nauseous or bloated.  Likewise meals heavy in dairy and vegetables can cause extra gas to form and this can also increase the bloating.  Drink plenty of fluids but you should avoid alcoholic beverages for 24 hours.  ACTIVITY: Your care partner should take you home directly after the procedure.  You should plan to take it easy, moving slowly for the rest of the day.  You can resume normal activity the day after the procedure however you should NOT DRIVE or use heavy machinery for 24 hours (because of the sedation medicines used during the test).    SYMPTOMS TO REPORT IMMEDIATELY: A gastroenterologist can be reached at any hour.  During normal business hours, 8:30 AM to 5:00 PM Monday through Friday,  call [phone].  After hours and on weekends, please call the GI answering service at [phone] who will take a message and have the physician on call contact you.   Following lower endoscopy (colonoscopy or flexible sigmoidoscopy):  Excessive amounts of blood in the stool  Significant tenderness or worsening of abdominal pains  Swelling of the abdomen that is new, acute  Fever of 100F or higher  FOLLOW UP: If any biopsies were taken you will be contacted by phone or by letter within the next 1-3 weeks.  Call your gastroenterologist if you have not heard about the biopsies in 3 weeks.  Our staff will call the home number listed on your records the next business day following your procedure to check on you and address any questions or concerns that you may have at that time regarding the information given to you following your procedure. This is a courtesy call and so if there is no answer at the home number and we have not heard from you through the emergency physician on call, we will assume that you have returned to your regular daily activities without incident.  SIGNATURES/CONFIDENTIALITY: You and/or your care partner have signed paperwork which will be entered into your electronic medical record.  These signatures attest to the fact that that the information above on your After Visit Summary has been reviewed and is understood.  Full responsibility of the confidentiality of this   discharge information lies with you and/or your care-partner.     You may resume your current medications today. Please call if any questions or concerns.   

## 2014-04-24 NOTE — Progress Notes (Signed)
No problems noted in the recovery room. maw 

## 2014-04-24 NOTE — Op Note (Signed)
[facility] [address] [place], [id number]   COLONOSCOPY PROCEDURE REPORT  PATIENT: [name] [name]  MR#: [id number] BIRTHDATE: 08-10-1950 , 63  yrs. old GENDER: female ENDOSCOPIST: Louis Meckelobert D Kaplan, MD REFERRED BY: PROCEDURE DATE:  04/24/2014 PROCEDURE:   Colonoscopy, diagnostic First Screening Colonoscopy - Avg.  risk and is 50 yrs.  old or older - No.  Prior Negative Screening - Now for repeat screening. 10 or more years since last screening  History of Adenoma - Now for follow-up colonoscopy & has been > or = to 3 yrs.  N/A  Polyps Removed Today? No.  Recommend repeat exam, <10 yrs? No. ASA CLASS:   Class II INDICATIONS:average risk for colon cancer. MEDICATIONS: Monitored anesthesia care and Propofol 200 mg IV  DESCRIPTION OF PROCEDURE:   After the risks benefits and alternatives of the procedure were thoroughly explained, informed consent was obtained.  The digital rectal exam revealed no abnormalities of the rectum.   The LB [id number]  endoscope was introduced through the anus and advanced to the cecum, which was identified by both the appendix and ileocecal valve. No adverse events experienced.   The quality of the prep was excellent using Suprep  The instrument was then slowly withdrawn as the colon was fully examined.      COLON FINDINGS: A normal appearing cecum, ileocecal valve, and appendiceal orifice were identified.  The ascending, transverse, descending, sigmoid colon, and rectum appeared unremarkable. Retroflexed views revealed no abnormalities. The time to cecum=4 minutes 34 seconds.  Withdrawal time=7 minutes 48 seconds.  The scope was withdrawn and the procedure completed. COMPLICATIONS: There were no immediate complications.  ENDOSCOPIC IMPRESSION: Normal colonoscopy  RECOMMENDATIONS: Continue current colorectal screening recommendations for "routine risk" patients with a repeat colonoscopy in 10 years.  eSigned:  Louis Meckelobert  D Kaplan, MD 04/24/2014 9:24 AM   cc: Corwin LevinsJames W John, MD

## 2014-04-25 ENCOUNTER — Telehealth: Payer: Self-pay | Admitting: *Deleted

## 2014-04-25 NOTE — Telephone Encounter (Signed)
  Follow up Call-  Call back number 04/24/2014  Post procedure Call Back phone  # [phone]  Permission to leave phone message Yes     Patient questions:  Do you have a fever, pain , or abdominal swelling? No. Pain Score  0 *  Have you tolerated food without any problems? Yes.    Have you been able to return to your normal activities? Yes.    Do you have any questions about your discharge instructions: Diet   No. Medications  No. Follow up visit  No.  Do you have questions or concerns about your Care? No.  Actions: * If pain score is 4 or above: No action needed, pain <4.

## 2014-06-19 ENCOUNTER — Other Ambulatory Visit: Payer: Self-pay | Admitting: Internal Medicine

## 2014-09-30 ENCOUNTER — Telehealth: Payer: Self-pay

## 2014-09-30 NOTE — Telephone Encounter (Signed)
LVM for patient to call for status of flu shot this season or she may obtain one at the practice by 10/17/2014.

## 2014-10-16 ENCOUNTER — Encounter: Payer: Self-pay | Admitting: Internal Medicine

## 2014-10-16 ENCOUNTER — Other Ambulatory Visit (INDEPENDENT_AMBULATORY_CARE_PROVIDER_SITE_OTHER): Payer: Medicare PPO

## 2014-10-16 ENCOUNTER — Ambulatory Visit (INDEPENDENT_AMBULATORY_CARE_PROVIDER_SITE_OTHER): Payer: Medicare PPO | Admitting: Internal Medicine

## 2014-10-16 VITALS — BP 124/76 | HR 82 | Temp 98.2°F | Resp 18 | Ht 68.0 in | Wt 240.0 lb

## 2014-10-16 DIAGNOSIS — Z0000 Encounter for general adult medical examination without abnormal findings: Secondary | ICD-10-CM | POA: Diagnosis not present

## 2014-10-16 DIAGNOSIS — I10 Essential (primary) hypertension: Secondary | ICD-10-CM | POA: Diagnosis not present

## 2014-10-16 DIAGNOSIS — L309 Dermatitis, unspecified: Secondary | ICD-10-CM | POA: Diagnosis not present

## 2014-10-16 HISTORY — DX: Dermatitis, unspecified: L30.9

## 2014-10-16 LAB — CBC WITH DIFFERENTIAL/PLATELET
BASOS ABS: 0.1 10*3/uL (ref 0.0–0.1)
Basophils Relative: 1 % (ref 0.0–3.0)
EOS ABS: 0.4 10*3/uL (ref 0.0–0.7)
Eosinophils Relative: 4.7 % (ref 0.0–5.0)
HCT: 35.3 % — ABNORMAL LOW (ref 36.0–46.0)
Hemoglobin: 12.6 g/dL (ref 12.0–15.0)
LYMPHS PCT: 22.8 % (ref 12.0–46.0)
Lymphs Abs: 2 10*3/uL (ref 0.7–4.0)
MCHC: 35.6 g/dL (ref 30.0–36.0)
MCV: 92.9 fl (ref 78.0–100.0)
Monocytes Absolute: 0.4 10*3/uL (ref 0.1–1.0)
Monocytes Relative: 5.1 % (ref 3.0–12.0)
NEUTROS PCT: 66.4 % (ref 43.0–77.0)
Neutro Abs: 5.8 10*3/uL (ref 1.4–7.7)
Platelets: 277 10*3/uL (ref 150.0–400.0)
RBC: 3.8 Mil/uL — ABNORMAL LOW (ref 3.87–5.11)
RDW: 15.4 % (ref 11.5–15.5)
WBC: 8.8 10*3/uL (ref 4.0–10.5)

## 2014-10-16 LAB — LIPID PANEL
CHOL/HDL RATIO: 3
Cholesterol: 186 mg/dL (ref 0–200)
HDL: 58.5 mg/dL (ref >39.00)
LDL Cholesterol: 99 mg/dL (ref 0–99)
NonHDL: 127.5
TRIGLYCERIDES: 144 mg/dL (ref 0.0–149.0)
VLDL: 28.8 mg/dL (ref 0.0–40.0)

## 2014-10-16 LAB — HEPATIC FUNCTION PANEL
ALBUMIN: 4.5 g/dL (ref 3.5–5.2)
ALT: 30 U/L (ref 0–35)
AST: 30 U/L (ref 0–37)
Alkaline Phosphatase: 53 U/L (ref 39–117)
BILIRUBIN DIRECT: 0.2 mg/dL (ref 0.0–0.3)
TOTAL PROTEIN: 7.4 g/dL (ref 6.0–8.3)
Total Bilirubin: 0.7 mg/dL (ref 0.2–1.2)

## 2014-10-16 LAB — BASIC METABOLIC PANEL
BUN: 30 mg/dL — ABNORMAL HIGH (ref 6–23)
CHLORIDE: 100 meq/L (ref 96–112)
CO2: 33 meq/L — AB (ref 19–32)
Calcium: 10.3 mg/dL (ref 8.4–10.5)
Creatinine, Ser: 1.43 mg/dL — ABNORMAL HIGH (ref 0.40–1.20)
GFR: 39.08 mL/min — ABNORMAL LOW (ref >60.00)
Glucose, Bld: 96 mg/dL (ref 70–99)
Potassium: 4.3 mEq/L (ref 3.5–5.1)
Sodium: 136 mEq/L (ref 135–145)

## 2014-10-16 LAB — TSH: TSH: 1.94 u[IU]/mL (ref 0.35–4.50)

## 2014-10-16 MED ORDER — DESOXIMETASONE 0.05 % EX CREA: 1.0000 "application " | TOPICAL_CREAM | CUTANEOUS | Status: DC | PRN

## 2014-10-16 MED ORDER — FLUOCINONIDE 0.05 % EX CREA: 1.0000 "application " | TOPICAL_CREAM | Freq: Two times a day (BID) | CUTANEOUS | Status: DC

## 2014-10-16 MED ORDER — HYDROCHLOROTHIAZIDE 25 MG PO TABS: 25.0000 mg | ORAL_TABLET | Freq: Every day | ORAL | Status: DC

## 2014-10-16 MED ORDER — LISINOPRIL 20 MG PO TABS: ORAL_TABLET | ORAL | Status: DC

## 2014-10-16 MED ORDER — TRIAMCINOLONE ACETONIDE 0.1 % EX CREA: 1.0000 "application " | TOPICAL_CREAM | CUTANEOUS | Status: DC | PRN

## 2014-10-16 NOTE — Assessment & Plan Note (Signed)
Ok for trial change of steroid tx topical

## 2014-10-16 NOTE — Patient Instructions (Signed)
Your EKG was OK today  Please take all new medication as prescribed - the new cream (sent to the pharmacy)  Please continue all other medications as before, and refills have been done if requested.  Please have the pharmacy call with any other refills you may need.  Please continue your efforts at being more active, low cholesterol diet, and weight control.  You are otherwise up to date with prevention measures today.  Please keep your appointments with your specialists as you may have planned  Please go to the LAB in the Basement (turn left off the elevator) for the tests to be done today  You will be contacted by phone if any changes need to be made immediately.  Otherwise, you will receive a letter about your results with an explanation, but please check with MyChart first.  Please remember to sign up for MyChart if you have not done so, as this will be important to you in the future with finding out test results, communicating by private email, and scheduling acute appointments online when needed.  Please return in 1 year for your yearly visit, or sooner if needed, with Lab testing done 3-5 days before

## 2014-10-16 NOTE — Progress Notes (Signed)
Pre visit review using our clinic review tool, if applicable. No additional management support is needed unless otherwise documented below in the visit note. 

## 2014-10-16 NOTE — Progress Notes (Signed)
Subjective:    Patient ID: [name] [name], female    DOB: 12/01/1950, 64 y.o.   MRN: [id number]  HPI  Here for wellness and f/u;  Overall doing ok;  Pt denies Chest pain, worsening SOB, DOE, wheezing, orthopnea, PND, worsening LE edema, palpitations, dizziness or syncope.  Pt denies neurological change such as new headache, facial or extremity weakness.  Pt denies polydipsia, polyuria, or low sugar symptoms. Pt states overall good compliance with treatment and medications, good tolerability, and has been trying to follow appropriate diet.  Pt denies worsening depressive symptoms, suicidal ideation or panic. No fever, night sweats, wt loss, loss of appetite, or other constitutional symptoms.  Pt states good ability with ADL's, has low fall risk, home safety reviewed and adequate, no other significant changes in hearing or vision, and only occasionally active with exercise. Has tried mult steroid cr for eczema and dry patches for face, including triam cr and desoximetasone oint , but doesn't like ointments.  Has hx of gout left big toe, elev previous uric acid.  No recent flares, wondering if needs to stop the hct.  Past Medical History  Diagnosis Date  . Obesity   . Hypertension   . Arthritis     knee   Past Surgical History  Procedure Laterality Date  . Anal fissure repair  1998  . Total abdominal hysterectomy  1995  . Arthroscopic repair acl Right 2010  . Diagnostic mammogram  2013  . Myomectomy  1985    reports that she has never smoked. She has never used smokeless tobacco. She reports that she does not drink alcohol or use illicit drugs. family history includes COPD in her father; Coronary artery disease in her mother; Heart disease in her maternal grandfather; Hyperlipidemia in her mother; Hypertension in her maternal grandmother and mother; Lupus in her mother; Stroke in her maternal grandmother, paternal grandfather, and paternal grandmother. There is no history of Cancer or Colon  cancer. No Known Allergies Current Outpatient Prescriptions on File Prior to Visit  Medication Sig Dispense Refill  . aspirin 81 MG tablet Take 81 mg by mouth daily.      . hydrochlorothiazide (HYDRODIURIL) 25 MG tablet TAKE ONE TABLET BY MOUTH ONCE DAILY 90 tablet 0  . lisinopril (PRINIVIL,ZESTRIL) 20 MG tablet TAKE ONE TABLET BY MOUTH ONCE DAILY (PATIENT  NEEDS  APPOINTMENT) 90 tablet 0  . naproxen sodium (ANAPROX) 220 MG tablet Take 220 mg by mouth every morning.     Current Facility-Administered Medications on File Prior to Visit  Medication Dose Route Frequency Provider Last Rate Last Dose  . pneumococcal 23 valent vaccine (PNU-IMMUNE) injection 0.5 mL  0.5 mL Intramuscular Once Jacques NavyMichael E Norins, MD      . Lady GaryDaP (BOOSTRIX) injection 0.5 mL  0.5 mL Intramuscular Once Jacques NavyMichael E Norins, MD       Review of Systems Constitutional: Negative for increased diaphoresis, other activity, appetite or siginficant weight change other than noted HENT: Negative for worsening hearing loss, ear pain, facial swelling, mouth sores and neck stiffness.   Eyes: Negative for other worsening pain, redness or visual disturbance.  Respiratory: Negative for shortness of breath and wheezing  Cardiovascular: Negative for chest pain and palpitations.  Gastrointestinal: Negative for diarrhea, blood in stool, abdominal distention or other pain Genitourinary: Negative for hematuria, flank pain or change in urine volume.  Musculoskeletal: Negative for myalgias or other joint complaints.  Skin: Negative for color change and wound or drainage.  Neurological: Negative for syncope  and numbness. other than noted Hematological: Negative for adenopathy. or other swelling Psychiatric/Behavioral: Negative for hallucinations, SI, self-injury, decreased concentration or other worsening agitation.      Objective:   Physical Exam BP 124/76 mmHg  Pulse 82  Temp(Src) 98.2 F (36.8 C) (Oral)  Resp 18  Ht  (1.727 m)  Wt  240 lb (108.863 kg)  BMI 36.50 kg/m2  SpO2 95% VS noted,  Constitutional: Pt is oriented to person, place, and time. Appears well-developed and well-nourished, in no significant distress Head: Normocephalic and atraumatic.  Right Ear: External ear normal.  Left Ear: External ear normal.  Nose: Nose normal.  Mouth/Throat: Oropharynx is clear and moist.  Eyes: Conjunctivae and EOM are normal. Pupils are equal, round, and reactive to light.  Neck: Normal range of motion. Neck supple. No JVD present. No tracheal deviation present or significant neck LA or mass Cardiovascular: Normal rate, regular rhythm, normal heart sounds and intact distal pulses.   Pulmonary/Chest: Effort normal and breath sounds without rales or wheezing  Abdominal: Soft. Bowel sounds are normal. NT. No HSM  Musculoskeletal: Normal range of motion. Exhibits no edema.  Lymphadenopathy:  Has no cervical adenopathy.  Neurological: Pt is alert and oriented to person, place, and time. Pt has normal reflexes. No cranial nerve deficit. Motor grossly intact Skin: Skin is warm and dry. No rash noted. except several eczema lesions to right hand and wrist Psychiatric:  Has normal mood and affect. Behavior is normal.     Assessment & Plan:

## 2014-10-16 NOTE — Assessment & Plan Note (Signed)

## 2014-10-17 LAB — URINALYSIS, ROUTINE W REFLEX MICROSCOPIC
BILIRUBIN URINE: NEGATIVE
Hgb urine dipstick: NEGATIVE
KETONES UR: NEGATIVE
NITRITE: NEGATIVE
RBC / HPF: NONE SEEN (ref 0–?)
SPECIFIC GRAVITY, URINE: 1.02 (ref 1.000–1.030)
TOTAL PROTEIN, URINE-UPE24: NEGATIVE
Urine Glucose: NEGATIVE
Urobilinogen, UA: 0.2 (ref 0.0–1.0)
pH: 6 (ref 5.0–8.0)

## 2014-10-18 ENCOUNTER — Encounter: Payer: Self-pay | Admitting: Internal Medicine

## 2015-03-20 LAB — HM MAMMOGRAPHY: HM MAMMO: NEGATIVE

## 2015-03-26 ENCOUNTER — Encounter: Payer: Self-pay | Admitting: Internal Medicine

## 2015-03-31 ENCOUNTER — Encounter: Payer: Self-pay | Admitting: Internal Medicine

## 2015-08-14 ENCOUNTER — Ambulatory Visit (INDEPENDENT_AMBULATORY_CARE_PROVIDER_SITE_OTHER)
Admission: RE | Admit: 2015-08-14 | Discharge: 2015-08-14 | Disposition: A | Discharge status: Home / Self Care | Payer: 59 | Source: Ambulatory Visit | Attending: Internal Medicine | Admitting: Internal Medicine

## 2015-08-14 ENCOUNTER — Other Ambulatory Visit (INDEPENDENT_AMBULATORY_CARE_PROVIDER_SITE_OTHER): Payer: 59

## 2015-08-14 ENCOUNTER — Encounter: Payer: Self-pay | Admitting: Internal Medicine

## 2015-08-14 ENCOUNTER — Ambulatory Visit (INDEPENDENT_AMBULATORY_CARE_PROVIDER_SITE_OTHER): Payer: 59 | Admitting: Internal Medicine

## 2015-08-14 VITALS — BP 128/80 | HR 90 | Temp 98.3°F | Resp 20 | Ht 68.0 in | Wt 243.0 lb

## 2015-08-14 DIAGNOSIS — R0600 Dyspnea, unspecified: Secondary | ICD-10-CM | POA: Diagnosis not present

## 2015-08-14 DIAGNOSIS — Z23 Encounter for immunization: Secondary | ICD-10-CM

## 2015-08-14 DIAGNOSIS — Z0000 Encounter for general adult medical examination without abnormal findings: Secondary | ICD-10-CM | POA: Diagnosis not present

## 2015-08-14 DIAGNOSIS — I10 Essential (primary) hypertension: Secondary | ICD-10-CM

## 2015-08-14 DIAGNOSIS — J309 Allergic rhinitis, unspecified: Secondary | ICD-10-CM

## 2015-08-14 DIAGNOSIS — F411 Generalized anxiety disorder: Secondary | ICD-10-CM

## 2015-08-14 LAB — URINALYSIS, ROUTINE W REFLEX MICROSCOPIC
BILIRUBIN URINE: NEGATIVE
Hgb urine dipstick: NEGATIVE
KETONES UR: NEGATIVE
Nitrite: NEGATIVE
Specific Gravity, Urine: 1.015 (ref 1.000–1.030)
Total Protein, Urine: NEGATIVE
UROBILINOGEN UA: 0.2 (ref 0.0–1.0)
Urine Glucose: NEGATIVE
pH: 6.5 (ref 5.0–8.0)

## 2015-08-14 LAB — CBC WITH DIFFERENTIAL/PLATELET
BASOS PCT: 1 % (ref 0.0–3.0)
Basophils Absolute: 0.1 10*3/uL (ref 0.0–0.1)
EOS ABS: 0.5 10*3/uL (ref 0.0–0.7)
Eosinophils Relative: 6.4 % — ABNORMAL HIGH (ref 0.0–5.0)
HEMATOCRIT: 35.4 % — AB (ref 36.0–46.0)
HEMOGLOBIN: 12.2 g/dL (ref 12.0–15.0)
LYMPHS PCT: 24.2 % (ref 12.0–46.0)
Lymphs Abs: 2 10*3/uL (ref 0.7–4.0)
MCHC: 34.3 g/dL (ref 30.0–36.0)
MCV: 94.1 fl (ref 78.0–100.0)
MONOS PCT: 4.9 % (ref 3.0–12.0)
Monocytes Absolute: 0.4 10*3/uL (ref 0.1–1.0)
NEUTROS ABS: 5.1 10*3/uL (ref 1.4–7.7)
Neutrophils Relative %: 63.5 % (ref 43.0–77.0)
PLATELETS: 243 10*3/uL (ref 150.0–400.0)
RBC: 3.76 Mil/uL — ABNORMAL LOW (ref 3.87–5.11)
RDW: 15.1 % (ref 11.5–15.5)
WBC: 8.1 10*3/uL (ref 4.0–10.5)

## 2015-08-14 LAB — TSH: TSH: 1.5 u[IU]/mL (ref 0.35–4.50)

## 2015-08-14 LAB — BASIC METABOLIC PANEL
BUN: 27 mg/dL — AB (ref 6–23)
CALCIUM: 9.5 mg/dL (ref 8.4–10.5)
CO2: 28 mEq/L (ref 19–32)
CREATININE: 1.29 mg/dL — AB (ref 0.40–1.20)
Chloride: 102 mEq/L (ref 96–112)
GFR: 43.9 mL/min — AB (ref >60.00)
Glucose, Bld: 97 mg/dL (ref 70–99)
Potassium: 4 mEq/L (ref 3.5–5.1)
Sodium: 137 mEq/L (ref 135–145)

## 2015-08-14 LAB — LIPID PANEL
CHOL/HDL RATIO: 3
Cholesterol: 184 mg/dL (ref 0–200)
HDL: 60.7 mg/dL (ref >39.00)
LDL CALC: 92 mg/dL (ref 0–99)
NONHDL: 122.8
TRIGLYCERIDES: 152 mg/dL — AB (ref 0.0–149.0)
VLDL: 30.4 mg/dL (ref 0.0–40.0)

## 2015-08-14 LAB — HEPATIC FUNCTION PANEL
ALT: 24 U/L (ref 0–35)
AST: 23 U/L (ref 0–37)
Albumin: 4.4 g/dL (ref 3.5–5.2)
Alkaline Phosphatase: 56 U/L (ref 39–117)
BILIRUBIN DIRECT: 0.1 mg/dL (ref 0.0–0.3)
BILIRUBIN TOTAL: 0.7 mg/dL (ref 0.2–1.2)
Total Protein: 7.2 g/dL (ref 6.0–8.3)

## 2015-08-14 MED ORDER — ALBUTEROL SULFATE HFA 108 (90 BASE) MCG/ACT IN AERS: 2.0000 | INHALATION_SPRAY | Freq: Four times a day (QID) | RESPIRATORY_TRACT | Status: DC | PRN

## 2015-08-14 MED ORDER — ESCITALOPRAM OXALATE 10 MG PO TABS: 10.0000 mg | ORAL_TABLET | Freq: Every day | ORAL | Status: DC

## 2015-08-14 NOTE — Progress Notes (Signed)
Pre visit review using our clinic review tool, if applicable. No additional management support is needed unless otherwise documented below in the visit note. 

## 2015-08-14 NOTE — Patient Instructions (Addendum)
You had the flu shot today  Please take all new medication as prescribed - the inhaler, and the lexapro 10 mg per day  Please continue all other medications as before, and refills have been done if requested.  Please have the pharmacy call with any other refills you may need.  Please continue your efforts at being more active, low cholesterol diet, and weight control.  You are otherwise up to date with prevention measures today.  Please keep your appointments with your specialists as you may have planned  Please go to the XRAY Department in the Basement (go straight as you get off the elevator) for the x-ray testing  Please go to the LAB in the Basement (turn left off the elevator) for the tests to be done today  You will be contacted by phone if any changes need to be made immediately.  Otherwise, you will receive a letter about your results with an explanation, but please check with MyChart first.  Please remember to sign up for MyChart if you have not done so, as this will be important to you in the future with finding out test results, communicating by private email, and scheduling acute appointments online when needed.  Please return in 1 year for your yearly visit, or sooner if needed, with Lab testing done 3-5 days before  OK to cancel any appt later this spring

## 2015-08-14 NOTE — Progress Notes (Signed)
Subjective:    Patient ID: [name] [name], female    DOB: February 07, 1951, 65 y.o.   MRN: [id number]  HPI  Here for wellness and f/u;  Overall doing ok;  Pt denies Chest pain, but has mild intermittent worsening SOB, DOEwith wheezing in the last few wks, without orthopnea, PND, worsening LE edema, palpitations, dizziness or syncope.  Pt denies neurological change such as new headache, facial or extremity weakness.  Pt denies polydipsia, polyuria, or low sugar symptoms. Pt states overall good compliance with treatment and medications, good tolerability, and has been trying to follow appropriate diet.  Pt denies worsening depressive symptoms, suicidal ideation or pani, but has worsening several months anxiety, asks for tx that is non addictive. No fever, night sweats, wt loss, loss of appetite, or other constitutional symptoms.  Pt states good ability with ADL's, has low fall risk, home safety reviewed and adequate, no other significant changes in hearing or vision, and only occasionally active with exercise  Due for Hep C screen.  Does have several wks ongoing nasal allergy symptoms with clearish congestion, itch and sneezing, without fever, pain, ST, cough, swelling or wheezing.   Past Medical History  Diagnosis Date  . Obesity   . Hypertension   . Arthritis     knee  . Eczema 10/16/2014   Past Surgical History  Procedure Laterality Date  . Anal fissure repair  1998  . Total abdominal hysterectomy  1995  . Arthroscopic repair acl Right 2010  . Diagnostic mammogram  2013  . Myomectomy  1985    reports that she has never smoked. She has never used smokeless tobacco. She reports that she does not drink alcohol or use illicit drugs. family history includes COPD in her father; Coronary artery disease in her mother; Heart disease in her maternal grandfather; Hyperlipidemia in her mother; Hypertension in her maternal grandmother and mother; Lupus in her mother; Stroke in her maternal grandmother,  paternal grandfather, and paternal grandmother. There is no history of Cancer or Colon cancer. No Known Allergies Current Outpatient Prescriptions on File Prior to Visit  Medication Sig Dispense Refill  . aspirin 81 MG tablet Take 81 mg by mouth daily.      Marland Kitchen desoximetasone (TOPICORT) 0.05 % cream Apply 1 application topically as needed. 30 g 3  . fluocinonide cream (LIDEX) 0.05 % Apply 1 application topically 2 (two) times daily. 60 g 1  . hydrochlorothiazide (HYDRODIURIL) 25 MG tablet Take 1 tablet (25 mg total) by mouth daily. 90 tablet 3  . lisinopril (PRINIVIL,ZESTRIL) 20 MG tablet TAKE ONE TABLET BY MOUTH ONCE DAILY 90 tablet 3  . naproxen sodium (ANAPROX) 220 MG tablet Take 220 mg by mouth every morning.    . triamcinolone cream (KENALOG) 0.1 % Apply 1 application topically as needed. 90 g 3   No current facility-administered medications on file prior to visit.   Review of Systems Constitutional: Negative for increased diaphoresis, other activity, appetite or siginficant weight change other than noted HENT: Negative for worsening hearing loss, ear pain, facial swelling, mouth sores and neck stiffness.   Eyes: Negative for other worsening pain, redness or visual disturbance.  Respiratory: Negative for shortness of breath and wheezing  Cardiovascular: Negative for chest pain and palpitations.  Gastrointestinal: Negative for diarrhea, blood in stool, abdominal distention or other pain Genitourinary: Negative for hematuria, flank pain or change in urine volume.  Musculoskeletal: Negative for myalgias or other joint complaints.  Skin: Negative for color change and wound or  drainage.  Neurological: Negative for syncope and numbness. other than noted Hematological: Negative for adenopathy. or other swelling Psychiatric/Behavioral: Negative for hallucinations, SI, self-injury, decreased concentration or other worsening agitation.      Objective:   Physical Exam BP 128/80 mmHg  Pulse 90   Temp(Src) 98.3 F (36.8 C) (Oral)  Resp 20  Ht  (1.727 m)  Wt 243 lb (110.224 kg)  BMI 36.96 kg/m2  SpO2 96% VS noted,  Constitutional: Pt is oriented to person, place, and time. Appears well-developed and well-nourished, in no significant distress Head: Normocephalic and atraumatic.  Right Ear: External ear normal.  Left Ear: External ear normal.  Nose: Nose normal.  Mouth/Throat: Oropharynx is clear and moist.  Bilat tm's with mild erythema.  Max sinus areas non tender.  Pharynx with mild erythema, no exudate Eyes: Conjunctivae and EOM are normal. Pupils are equal, round, and reactive to light.  Neck: Normal range of motion. Neck supple. No JVD present. No tracheal deviation present or significant neck LA or mass Cardiovascular: Normal rate, regular rhythm, normal heart sounds and intact distal pulses.   Pulmonary/Chest: Effort normal and breath sounds without rales or wheezing  Abdominal: Soft. Bowel sounds are normal. NT. No HSM  Musculoskeletal: Normal range of motion. Exhibits no edema.  Lymphadenopathy:  Has no cervical adenopathy.  Neurological: Pt is alert and oriented to person, place, and time. Pt has normal reflexes. No cranial nerve deficit. Motor grossly intact Skin: Skin is warm and dry. No rash noted.  Psychiatric:  Has 2+ nervous mood and affect. Behavior is normal.      Assessment & Plan:

## 2015-08-15 LAB — HEPATITIS C ANTIBODY: HCV AB: NEGATIVE

## 2015-08-16 NOTE — Assessment & Plan Note (Signed)

## 2015-08-16 NOTE — Assessment & Plan Note (Signed)
Mild to mod, for zyrtec 10 mg prn,  to f/u any worsening symptoms or concerns 

## 2015-08-16 NOTE — Assessment & Plan Note (Signed)
Mild to mod, for start lexapro 10 qd,  to f/u any worsening symptoms or concerns, declines referral for counseling

## 2015-08-16 NOTE — Assessment & Plan Note (Signed)
I suspect possible mild intermittent asthma, for cxr, also Albut MDI prn trial,  to f/u any worsening symptoms or concerns

## 2015-10-03 ENCOUNTER — Telehealth: Payer: Self-pay

## 2015-10-03 NOTE — Telephone Encounter (Signed)
Form signed and placed in cabinet for pt pick up. Pt advised of same

## 2015-10-03 NOTE — Telephone Encounter (Signed)
Pt dropped off DMV form for a renewal, pt realizes there's another MD's name, but she needs Dr. Jonny RuizJohn to sign it. I told her we can get a blank DMV form if needed. Please call pt if there are any questions or concerns in regards to getting it filled out, [place].   New handicap placard placed on MD's desk for signature

## 2015-12-04 ENCOUNTER — Ambulatory Visit (INDEPENDENT_AMBULATORY_CARE_PROVIDER_SITE_OTHER): Payer: Medicare Other | Admitting: Family

## 2015-12-04 ENCOUNTER — Encounter: Payer: Self-pay | Admitting: Family

## 2015-12-04 VITALS — BP 118/62 | HR 83 | Temp 98.3°F | Ht 68.0 in | Wt 248.5 lb

## 2015-12-04 DIAGNOSIS — M109 Gout, unspecified: Secondary | ICD-10-CM | POA: Diagnosis not present

## 2015-12-04 MED ORDER — PREDNISONE 10 MG PO TABS: ORAL_TABLET | ORAL | Status: DC

## 2015-12-04 NOTE — Patient Instructions (Addendum)
If there is no improvement in your symptoms, or if there is any worsening of symptoms, or if you have any additional concerns, please return for re-evaluation; or, if we are closed, consider going to the Emergency Room for evaluation if symptoms urgent.  Gout Gout is an inflammatory arthritis caused by a buildup of uric acid crystals in the joints. Uric acid is a chemical that is normally present in the blood. When the level of uric acid in the blood is too high it can form crystals that deposit in your joints and tissues. This causes joint redness, soreness, and swelling (inflammation). Repeat attacks are common. Over time, uric acid crystals can form into masses (tophi) near a joint, destroying bone and causing disfigurement. Gout is treatable and often preventable. CAUSES  The disease begins with elevated levels of uric acid in the blood. Uric acid is produced by your body when it breaks down a naturally found substance called purines. Certain foods you eat, such as meats and fish, contain high amounts of purines. Causes of an elevated uric acid level include:  Being passed down from parent to child (heredity).  Diseases that cause increased uric acid production (such as obesity, psoriasis, and certain cancers).  Excessive alcohol use.  Diet, especially diets rich in meat and seafood.  Medicines, including certain cancer-fighting medicines (chemotherapy), water pills (diuretics), and aspirin.  Chronic kidney disease. The kidneys are no longer able to remove uric acid well.  Problems with metabolism. Conditions strongly associated with gout include:  Obesity.  High blood pressure.  High cholesterol.  Diabetes. Not everyone with elevated uric acid levels gets gout. It is not understood why some people get gout and others do not. Surgery, joint injury, and eating too much of certain foods are some of the factors that can lead to gout attacks. SYMPTOMS   An attack of gout comes on  quickly. It causes intense pain with redness, swelling, and warmth in a joint.  Fever can occur.  Often, only one joint is involved. Certain joints are more commonly involved:  Base of the big toe.  Knee.  Ankle.  Wrist.  Finger. Without treatment, an attack usually goes away in a few days to weeks. Between attacks, you usually will not have symptoms, which is different from many other forms of arthritis. DIAGNOSIS  Your caregiver will suspect gout based on your symptoms and exam. In some cases, tests may be recommended. The tests may include:  Blood tests.  Urine tests.  X-rays.  Joint fluid exam. This exam requires a needle to remove fluid from the joint (arthrocentesis). Using a microscope, gout is confirmed when uric acid crystals are seen in the joint fluid. TREATMENT  There are two phases to gout treatment: treating the sudden onset (acute) attack and preventing attacks (prophylaxis).  Treatment of an Acute Attack.  Medicines are used. These include anti-inflammatory medicines or steroid medicines.  An injection of steroid medicine into the affected joint is sometimes necessary.  The painful joint is rested. Movement can worsen the arthritis.  You may use warm or cold treatments on painful joints, depending which works best for you.  Treatment to Prevent Attacks.  If you suffer from frequent gout attacks, your caregiver may advise preventive medicine. These medicines are started after the acute attack subsides. These medicines either help your kidneys eliminate uric acid from your body or decrease your uric acid production. You may need to stay on these medicines for a very long time.  The early  phase of treatment with preventive medicine can be associated with an increase in acute gout attacks. For this reason, during the first few months of treatment, your caregiver may also advise you to take medicines usually used for acute gout treatment. Be sure you understand  your caregiver's directions. Your caregiver may make several adjustments to your medicine dose before these medicines are effective.  Discuss dietary treatment with your caregiver or dietitian. Alcohol and drinks high in sugar and fructose and foods such as meat, poultry, and seafood can increase uric acid levels. Your caregiver or dietitian can advise you on drinks and foods that should be limited. HOME CARE INSTRUCTIONS   Do not take aspirin to relieve pain. This raises uric acid levels.  Only take over-the-counter or prescription medicines for pain, discomfort, or fever as directed by your caregiver.  Rest the joint as much as possible. When in bed, keep sheets and blankets off painful areas.  Keep the affected joint raised (elevated).  Apply warm or cold treatments to painful joints. Use of warm or cold treatments depends on which works best for you.  Use crutches if the painful joint is in your leg.  Drink enough fluids to keep your urine clear or pale yellow. This helps your body get rid of uric acid. Limit alcohol, sugary drinks, and fructose drinks.  Follow your dietary instructions. Pay careful attention to the amount of protein you eat. Your daily diet should emphasize fruits, vegetables, whole grains, and fat-free or low-fat milk products. Discuss the use of coffee, vitamin C, and cherries with your caregiver or dietitian. These may be helpful in lowering uric acid levels.  Maintain a healthy body weight. SEEK MEDICAL CARE IF:   You develop diarrhea, vomiting, or any side effects from medicines.  You do not feel better in 24 hours, or you are getting worse. SEEK IMMEDIATE MEDICAL CARE IF:   Your joint becomes suddenly more tender, and you have chills or a fever. MAKE SURE YOU:   Understand these instructions.  Will watch your condition.  Will get help right away if you are not doing well or get worse.   This information is not intended to replace advice given to you  by your health care provider. Make sure you discuss any questions you have with your health care provider.   Document Released: 07/02/2000 Document Revised: 07/26/2014 Document Reviewed: 02/16/2012 Elsevier Interactive Patient Education [employer]

## 2015-12-04 NOTE — Progress Notes (Signed)
Subjective:    Patient ID: [name] [name], female    DOB: [birth year], 65 y.o.   MRN: [id number]   [name] [name] is a 65 y.o. female who presents today for an acute visit.    HPI Comments: Patient states her right great toe has been red, swollen, and painful for week, worsening. Has had one prior episode of gout 2 years ago in left great toe.  Patient has tried to take indomethacin during prior gout flare up but had dizziness, nausea and agitation. Has taken prednisone in the past for gout flare which worked well. Takes naproxen sodium for knee pain.   No DM.   Past Medical History  Diagnosis Date  . Obesity   . Hypertension   . Arthritis     knee  . Eczema 10/16/2014   Allergies: Indomethacin Current Outpatient Prescriptions on File Prior to Visit  Medication Sig Dispense Refill  . albuterol (PROVENTIL HFA;VENTOLIN HFA) 108 (90 Base) MCG/ACT inhaler Inhale 2 puffs into the lungs every 6 (six) hours as needed for wheezing or shortness of breath. 1 Inhaler 5  . aspirin 81 MG tablet Take 81 mg by mouth daily.      Marland Kitchen desoximetasone (TOPICORT) 0.05 % cream Apply 1 application topically as needed. 30 g 3  . fluocinonide cream (LIDEX) 0.05 % Apply 1 application topically 2 (two) times daily. 60 g 1  . hydrochlorothiazide (HYDRODIURIL) 25 MG tablet Take 1 tablet (25 mg total) by mouth daily. 90 tablet 3  . lisinopril (PRINIVIL,ZESTRIL) 20 MG tablet TAKE ONE TABLET BY MOUTH ONCE DAILY 90 tablet 3  . naproxen sodium (ANAPROX) 220 MG tablet Take 220 mg by mouth every morning.    . escitalopram (LEXAPRO) 10 MG tablet Take 1 tablet (10 mg total) by mouth daily. (Patient not taking: Reported on 12/04/2015) 90 tablet 3   No current facility-administered medications on file prior to visit.    Social History  Substance Use Topics  . Smoking status: Never Smoker   . Smokeless tobacco: Never Used  . Alcohol Use: No    Review of Systems  Constitutional: Negative for fever and chills.    Musculoskeletal: Positive for joint swelling.      Objective:    Ht  (1.727 m)  Wt 248 lb 8 oz (112.719 kg)  BMI 37.79 kg/m2   Physical Exam  Constitutional: She appears well-developed and well-nourished.  Eyes: Conjunctivae are normal.  Cardiovascular: Normal rate, regular rhythm, normal heart sounds and normal pulses.   No lower extremity edema in calves or ankle. Palpable pedal pulses.  Pulmonary/Chest: Effort normal and breath sounds normal. She has no wheezes. She has no rhonchi. She has no rales.  Musculoskeletal:       Right foot: There is tenderness and bony tenderness. There is normal range of motion, no swelling and no laceration.       Feet:  Erythema, marked tenderness right great toe medial side. Increased warmth. Mild swelling.     Neurological: She is alert.  Skin: Skin is warm and dry.  Psychiatric: She has a normal mood and affect. Her speech is normal and behavior is normal. Thought content normal.  Vitals reviewed.      Assessment & Plan:   1. Acute gout, unspecified cause, unspecified site Working diagnosis of gout based on h/o gout and acute, marked tenderness, and podagra. No evidence of bacterial infection.  - predniSONE (DELTASONE) 10 MG tablet; Take 4 tablets ( total 40 mg) by  mouth for 2 days; take 3 tablets ( total 30 mg) by mouth for 2 days; take 2 tablets ( total 20 mg) by mouth for 1 day; take 1 tablet ( total 10 mg) by mouth for 1 day.  Dispense: 17 tablet; Refill: 0    I have discontinued Ms. Leyda's triamcinolone cream. I am also having her maintain her aspirin, naproxen sodium, desoximetasone, fluocinonide cream, lisinopril, hydrochlorothiazide, albuterol, escitalopram, and halobetasol.   Meds ordered this encounter  Medications  . halobetasol (ULTRAVATE) 0.05 % ointment    Sig:      Start medications as prescribed and explained to patient on After Visit Summary ( AVS). Risks, benefits, and alternatives of the medications and  treatment plan prescribed today were discussed, and patient expressed understanding.   Education regarding symptom management and diagnosis given to patient.   Follow-up:Plan follow-up as discussed or as needed if any worsening symptoms or change in condition.   Continue to follow with Oliver BarreJames John, MD for routine health maintenance.   [name] JettyLinda Faye [name] and I agreed with plan.   Rennie PlowmanMargaret Leyli Kevorkian, FNP

## 2015-12-04 NOTE — Progress Notes (Signed)
Pre visit review using our clinic review tool, if applicable. No additional management support is needed unless otherwise documented below in the visit note. 

## 2015-12-11 ENCOUNTER — Ambulatory Visit (INDEPENDENT_AMBULATORY_CARE_PROVIDER_SITE_OTHER): Payer: Medicare Other | Admitting: Internal Medicine

## 2015-12-11 ENCOUNTER — Encounter: Payer: Self-pay | Admitting: Internal Medicine

## 2015-12-11 ENCOUNTER — Other Ambulatory Visit: Payer: Medicare Other

## 2015-12-11 VITALS — BP 138/80 | HR 120 | Temp 98.3°F | Resp 20 | Wt 242.0 lb

## 2015-12-11 DIAGNOSIS — R300 Dysuria: Secondary | ICD-10-CM | POA: Diagnosis not present

## 2015-12-11 DIAGNOSIS — I10 Essential (primary) hypertension: Secondary | ICD-10-CM | POA: Diagnosis not present

## 2015-12-11 DIAGNOSIS — N3000 Acute cystitis without hematuria: Secondary | ICD-10-CM | POA: Diagnosis not present

## 2015-12-11 DIAGNOSIS — M109 Gout, unspecified: Secondary | ICD-10-CM

## 2015-12-11 DIAGNOSIS — M10071 Idiopathic gout, right ankle and foot: Secondary | ICD-10-CM | POA: Diagnosis not present

## 2015-12-11 MED ORDER — CIPROFLOXACIN HCL 500 MG PO TABS: 500.0000 mg | ORAL_TABLET | Freq: Two times a day (BID) | ORAL | Status: DC

## 2015-12-11 NOTE — Progress Notes (Signed)
Pre visit review using our clinic review tool, if applicable. No additional management support is needed unless otherwise documented below in the visit note. 

## 2015-12-11 NOTE — Assessment & Plan Note (Signed)
stable overall by history and exam, recent data reviewed with pt, and pt to continue medical treatment as before,  to f/u any worsening symptoms or concerns BP Readings from Last 3 Encounters:  12/11/15 138/80  12/04/15 118/62  08/14/15 128/80

## 2015-12-11 NOTE — Assessment & Plan Note (Signed)
Mild to mod, high suspicioun for UTi, for urine studies, also for antibx course,  to f/u any worsening symptoms or concerns

## 2015-12-11 NOTE — Patient Instructions (Signed)
Please take all new medication as prescribed - the antibiotic  Your specimen will be sent for testing and culture  Please continue all other medications as before, and refills have been done if requested.  Please have the pharmacy call with any other refills you may need.  Please keep your appointments with your specialists as you may have planned

## 2015-12-11 NOTE — Assessment & Plan Note (Signed)
Resolved, pt reassured, no need further steroid or other tx at this time

## 2015-12-11 NOTE — Progress Notes (Signed)
Subjective:    Patient ID: [name] [name], female    DOB: Feb 15, 1951, 65 y.o.   MRN: [id number]  HPI  Here with 2-3 days urinary dysuria and frequency with lower abd discomfort, Denies urinary symptoms such as frequency,  flank pain, hematuria or n/v, fever, chills. Last UTi - cannot recall.  Did have recent tx for right foot gout, now resolved, with steroid.  Pt denies chest pain, increased sob or doe, wheezing, orthopnea, PND, increased LE swelling, palpitations, dizziness or syncope.   Pt denies polydipsia, polyuria Past Medical History  Diagnosis Date  . Obesity   . Hypertension   . Arthritis     knee  . Eczema 10/16/2014   Past Surgical History  Procedure Laterality Date  . Anal fissure repair  1998  . Total abdominal hysterectomy  1995  . Arthroscopic repair acl Right 2010  . Diagnostic mammogram  2013  . Myomectomy  1985    reports that she has never smoked. She has never used smokeless tobacco. She reports that she does not drink alcohol or use illicit drugs. family history includes COPD in her father; Coronary artery disease in her mother; Heart disease in her maternal grandfather; Hyperlipidemia in her mother; Hypertension in her maternal grandmother and mother; Lupus in her mother; Stroke in her maternal grandmother, paternal grandfather, and paternal grandmother. There is no history of Cancer or Colon cancer. Allergies  Allergen Reactions  . Indomethacin Other (See Comments)    The reaction is more of an intolerance. Patient states that she has nausea, dizziness and agitation.    Current Outpatient Prescriptions on File Prior to Visit  Medication Sig Dispense Refill  . albuterol (PROVENTIL HFA;VENTOLIN HFA) 108 (90 Base) MCG/ACT inhaler Inhale 2 puffs into the lungs every 6 (six) hours as needed for wheezing or shortness of breath. 1 Inhaler 5  . aspirin 81 MG tablet Take 81 mg by mouth daily.      Marland Kitchen desoximetasone (TOPICORT) 0.05 % cream Apply 1 application topically  as needed. 30 g 3  . fluocinonide cream (LIDEX) 0.05 % Apply 1 application topically 2 (two) times daily. 60 g 1  . halobetasol (ULTRAVATE) 0.05 % ointment     . hydrochlorothiazide (HYDRODIURIL) 25 MG tablet Take 1 tablet (25 mg total) by mouth daily. 90 tablet 3  . lisinopril (PRINIVIL,ZESTRIL) 20 MG tablet TAKE ONE TABLET BY MOUTH ONCE DAILY 90 tablet 3  . naproxen sodium (ANAPROX) 220 MG tablet Take 220 mg by mouth every morning.    . predniSONE (DELTASONE) 10 MG tablet Take 4 tablets ( total 40 mg) by mouth for 2 days; take 3 tablets ( total 30 mg) by mouth for 2 days; take 2 tablets ( total 20 mg) by mouth for 1 day; take 1 tablet ( total 10 mg) by mouth for 1 day. 17 tablet 0  . escitalopram (LEXAPRO) 10 MG tablet Take 1 tablet (10 mg total) by mouth daily. (Patient not taking: Reported on 12/04/2015) 90 tablet 3   No current facility-administered medications on file prior to visit.   Review of Systems  Constitutional: Negative for unusual diaphoresis or night sweats HENT: Negative for ear swelling or discharge Eyes: Negative for worsening visual haziness  Respiratory: Negative for choking and stridor.   Gastrointestinal: Negative for distension or worsening eructation Genitourinary: Negative for retention or change in urine volume.  Musculoskeletal: Negative for other MSK pain or swelling Skin: Negative for color change and worsening wound Neurological: Negative for tremors and  numbness other than noted  Psychiatric/Behavioral: Negative for decreased concentration or agitation other than above       Objective:   Physical Exam BP 138/80 mmHg  Pulse 120  Temp(Src) 98.3 F (36.8 C) (Oral)  Resp 20  Wt 242 lb (109.77 kg)  SpO2 98% VS noted,  Constitutional: Pt appears in no apparent distress HENT: Head: NCAT.  Right Ear: External ear normal.  Left Ear: External ear normal.  Eyes: . Pupils are equal, round, and reactive to light. Conjunctivae and EOM are normal Neck: Normal  range of motion. Neck supple.  Cardiovascular: Normal rate and regular rhythm.   Pulmonary/Chest: Effort normal and breath sounds without rales or wheezing.  Abd; soft , low mid abd tender, ND, + BS, no flank tender Neurological: Pt is alert. Not confused , motor grossly intact Skin: Skin is warm. No rash, no LE edema Right foot with no swelling, erythema, tender Psychiatric: Pt behavior is normal. No agitation.      Assessment & Plan:

## 2015-12-14 LAB — CULTURE, URINE COMPREHENSIVE
COLONY COUNT: NO GROWTH
Organism ID, Bacteria: NO GROWTH

## 2015-12-16 ENCOUNTER — Encounter: Payer: Self-pay | Admitting: Internal Medicine

## 2015-12-16 ENCOUNTER — Telehealth: Payer: Self-pay | Admitting: Internal Medicine

## 2015-12-16 NOTE — Telephone Encounter (Signed)
Fortunately the urine culture from may 25 was negative, so can stop all antibx   If still having any other significant unexplained symptoms, I can refer to urology f

## 2015-12-16 NOTE — Telephone Encounter (Signed)
Please advise 

## 2015-12-16 NOTE — Telephone Encounter (Signed)
[name] [name] states [name] [name] was prescribed cipro for UTI.  States when [name] [name] started taking this med her foot started to swell and burn.  [name] [name] took half the script and then stopped it because of these symptoms.  [name] [name] states since [name] [name] has stopped this medication her foot has started to improve.  [name] [name] would like to know if Dr. Jonny RuizJohn would like her to come back in or if he can prescribe her something different for UTI?

## 2015-12-18 NOTE — Telephone Encounter (Signed)
Sorry, no need for further antibx needed with negative urine culture  Not all urinary pain or other symtpoms are infection related  I can refer to urology if has persistent symptoms

## 2015-12-18 NOTE — Telephone Encounter (Signed)
Patient just called wanting more abx for her uti. I explained what was noted. And she does want a referral to urology

## 2015-12-18 NOTE — Telephone Encounter (Signed)
Unable to reach patient, left message to give us a call back 

## 2015-12-18 NOTE — Telephone Encounter (Signed)
Please advise 

## 2015-12-23 ENCOUNTER — Other Ambulatory Visit: Payer: Self-pay | Admitting: Internal Medicine

## 2015-12-25 ENCOUNTER — Other Ambulatory Visit: Payer: Self-pay | Admitting: Internal Medicine

## 2016-01-21 ENCOUNTER — Other Ambulatory Visit: Payer: Self-pay | Admitting: Family

## 2016-02-02 NOTE — Telephone Encounter (Signed)
Stefannie, Would you call and skee if this patient needs refill for prednisone for another gout attack?  I saw her a couple of weeks ago for gout. Did the first course not work?   You may approve the refill however wanted to be sure why she needed it.

## 2016-02-03 NOTE — Telephone Encounter (Signed)
LVM for pt to call back as soon as possible.  RE: Need to know why pt is asking for a refill of prednisone.   Pharmacy stated that they did not send the request for prednisone. Awaiting pt response.

## 2016-02-03 NOTE — Telephone Encounter (Signed)
Stefannie please advise on Margaretes note, Dr. Jonny RuizJohn didn't prescribe this and we are not sure as to why she needs it. Please call patient.

## 2016-02-03 NOTE — Telephone Encounter (Signed)
stefannie please advise

## 2016-04-30 ENCOUNTER — Encounter: Payer: Self-pay | Admitting: Internal Medicine

## 2017-01-08 ENCOUNTER — Other Ambulatory Visit: Payer: Self-pay | Admitting: Internal Medicine

## 2017-02-16 ENCOUNTER — Ambulatory Visit (INDEPENDENT_AMBULATORY_CARE_PROVIDER_SITE_OTHER): Payer: Medicare Other | Admitting: Internal Medicine

## 2017-02-16 ENCOUNTER — Encounter: Payer: Self-pay | Admitting: Internal Medicine

## 2017-02-16 ENCOUNTER — Other Ambulatory Visit (INDEPENDENT_AMBULATORY_CARE_PROVIDER_SITE_OTHER): Payer: Medicare Other

## 2017-02-16 VITALS — BP 138/86 | HR 99 | Ht 68.0 in | Wt 247.0 lb

## 2017-02-16 DIAGNOSIS — G6289 Other specified polyneuropathies: Secondary | ICD-10-CM | POA: Diagnosis not present

## 2017-02-16 DIAGNOSIS — Z0000 Encounter for general adult medical examination without abnormal findings: Secondary | ICD-10-CM

## 2017-02-16 DIAGNOSIS — E2839 Other primary ovarian failure: Secondary | ICD-10-CM | POA: Diagnosis not present

## 2017-02-16 DIAGNOSIS — R202 Paresthesia of skin: Secondary | ICD-10-CM

## 2017-02-16 LAB — CBC WITH DIFFERENTIAL/PLATELET
Basophils Absolute: 0.1 10*3/uL (ref 0.0–0.1)
Basophils Relative: 1.5 % (ref 0.0–3.0)
EOS PCT: 5.4 % — AB (ref 0.0–5.0)
Eosinophils Absolute: 0.5 10*3/uL (ref 0.0–0.7)
HCT: 33.9 % — ABNORMAL LOW (ref 36.0–46.0)
HEMOGLOBIN: 11.9 g/dL — AB (ref 12.0–15.0)
LYMPHS ABS: 2 10*3/uL (ref 0.7–4.0)
Lymphocytes Relative: 22.2 % (ref 12.0–46.0)
MCHC: 35.2 g/dL (ref 30.0–36.0)
MCV: 97 fl (ref 78.0–100.0)
MONO ABS: 0.6 10*3/uL (ref 0.1–1.0)
Monocytes Relative: 6.8 % (ref 3.0–12.0)
NEUTROS PCT: 64.1 % (ref 43.0–77.0)
Neutro Abs: 5.9 10*3/uL (ref 1.4–7.7)
Platelets: 252 10*3/uL (ref 150.0–400.0)
RBC: 3.5 Mil/uL — AB (ref 3.87–5.11)
RDW: 15.2 % (ref 11.5–15.5)
WBC: 9.1 10*3/uL (ref 4.0–10.5)

## 2017-02-16 LAB — BASIC METABOLIC PANEL
BUN: 29 mg/dL — AB (ref 6–23)
CALCIUM: 9.7 mg/dL (ref 8.4–10.5)
CO2: 29 meq/L (ref 19–32)
Chloride: 100 mEq/L (ref 96–112)
Creatinine, Ser: 1.4 mg/dL — ABNORMAL HIGH (ref 0.40–1.20)
GFR: 39.77 mL/min — AB (ref >60.00)
Glucose, Bld: 98 mg/dL (ref 70–99)
POTASSIUM: 4.1 meq/L (ref 3.5–5.1)
Sodium: 136 mEq/L (ref 135–145)

## 2017-02-16 LAB — HEPATIC FUNCTION PANEL
ALBUMIN: 4.4 g/dL (ref 3.5–5.2)
ALK PHOS: 53 U/L (ref 39–117)
ALT: 29 U/L (ref 0–35)
AST: 31 U/L (ref 0–37)
Bilirubin, Direct: 0.2 mg/dL (ref 0.0–0.3)
TOTAL PROTEIN: 7.2 g/dL (ref 6.0–8.3)
Total Bilirubin: 0.7 mg/dL (ref 0.2–1.2)

## 2017-02-16 LAB — LIPID PANEL
CHOLESTEROL: 171 mg/dL (ref 0–200)
HDL: 53.2 mg/dL (ref >39.00)
LDL CALC: 84 mg/dL (ref 0–99)
NonHDL: 117.7
TRIGLYCERIDES: 168 mg/dL — AB (ref 0.0–149.0)
Total CHOL/HDL Ratio: 3
VLDL: 33.6 mg/dL (ref 0.0–40.0)

## 2017-02-16 MED ORDER — HYDROCHLOROTHIAZIDE 25 MG PO TABS: 25.0000 mg | ORAL_TABLET | Freq: Every day | ORAL | 3 refills | Status: DC

## 2017-02-16 MED ORDER — FLUOCINONIDE 0.05 % EX CREA: 1.0000 "application " | TOPICAL_CREAM | Freq: Two times a day (BID) | CUTANEOUS | 1 refills | Status: DC

## 2017-02-16 MED ORDER — DESOXIMETASONE 0.05 % EX CREA: 1.0000 "application " | TOPICAL_CREAM | CUTANEOUS | 3 refills | Status: DC | PRN

## 2017-02-16 MED ORDER — HALOBETASOL PROPIONATE 0.05 % EX OINT: TOPICAL_OINTMENT | Freq: Two times a day (BID) | CUTANEOUS | 1 refills | Status: DC

## 2017-02-16 MED ORDER — ALBUTEROL SULFATE HFA 108 (90 BASE) MCG/ACT IN AERS: 2.0000 | INHALATION_SPRAY | Freq: Four times a day (QID) | RESPIRATORY_TRACT | 5 refills | Status: DC | PRN

## 2017-02-16 NOTE — Progress Notes (Signed)
Subjective:    Patient ID: [name] [name], female    DOB: 10/19/1950, 66 y.o.   MRN: [id number]  HPI  Here for wellness and f/u;  Overall doing ok;  Pt denies Chest pain, worsening SOB, DOE, wheezing, orthopnea, PND, worsening LE edema, palpitations, dizziness or syncope.  Pt denies neurological change such as new headache, facial or extremity weakness.  Pt denies polydipsia, polyuria, or low sugar symptoms. Pt states overall good compliance with treatment and medications, good tolerability, and has been trying to follow appropriate diet.  Pt denies worsening depressive symptoms, suicidal ideation or panic. No fever, night sweats, wt loss, loss of appetite, or other constitutional symptoms.  Pt states good ability with ADL's, has low fall risk, home safety reviewed and adequate, no other significant changes in hearing or vision, and not active with exercise.  Does not feel she needs lexapro now.  No other complaints Past Medical History:  Diagnosis Date  . Arthritis    knee  . Eczema 10/16/2014  . Hypertension   . Obesity    Past Surgical History:  Procedure Laterality Date  . ANAL FISSURE REPAIR  1998  . ARTHROSCOPIC REPAIR ACL Right 2010  . DIAGNOSTIC MAMMOGRAM  2013  . MYOMECTOMY  1985  . TOTAL ABDOMINAL HYSTERECTOMY  1995    reports that she has never smoked. She has never used smokeless tobacco. She reports that she does not drink alcohol or use drugs. family history includes COPD in her father; Coronary artery disease in her mother; Heart disease in her maternal grandfather; Hyperlipidemia in her mother; Hypertension in her maternal grandmother and mother; Lupus in her mother; Stroke in her maternal grandmother, paternal grandfather, and paternal grandmother. Allergies  Allergen Reactions  . Indomethacin Other (See Comments)    The reaction is more of an intolerance. Patient states that she has nausea, dizziness and agitation.    Current Outpatient Prescriptions on File Prior to  Visit  Medication Sig Dispense Refill  . aspirin 81 MG tablet Take 81 mg by mouth daily.      . naproxen sodium (ANAPROX) 220 MG tablet Take 220 mg by mouth every morning.     No current facility-administered medications on file prior to visit.     Review of Systems Constitutional: Negative for other unusual diaphoresis, sweats, appetite or weight changes HENT: Negative for other worsening hearing loss, ear pain, facial swelling, mouth sores or neck stiffness.   Eyes: Negative for other worsening pain, redness or other visual disturbance.  Respiratory: Negative for other stridor or swelling Cardiovascular: Negative for other palpitations or other chest pain  Gastrointestinal: Negative for worsening diarrhea or loose stools, blood in stool, distention or other pain Genitourinary: Negative for hematuria, flank pain or other change in urine volume.  Musculoskeletal: Negative for myalgias or other joint swelling.  Skin: Negative for other color change, or other wound or worsening drainage.  Neurological: Negative for other syncope or numbness. Hematological: Negative for other adenopathy or swelling Psychiatric/Behavioral: Negative for hallucinations, other worsening agitation, SI, self-injury, or new decreased concentration All other system neg per pt    Objective:   Physical Exam BP 138/86   Pulse 99   Ht 5\' 8"  (1.727 m)   Wt 247 lb (112 kg)   SpO2 99%   BMI 37.56 kg/m  VS noted,  Constitutional: Pt is oriented to person, place, and time. Appears well-developed and well-nourished, in no significant distress and comfortable Head: Normocephalic and atraumatic  Eyes: Conjunctivae and EOM  are normal. Pupils are equal, round, and reactive to light Right Ear: External ear normal without discharge Left Ear: External ear normal without discharge Nose: Nose without discharge or deformity Mouth/Throat: Oropharynx is without other ulcerations and moist  Neck: Normal range of motion. Neck  supple. No JVD present. No tracheal deviation present or significant neck LA or mass Cardiovascular: Normal rate, regular rhythm, normal heart sounds and intact distal pulses.   Pulmonary/Chest: WOB normal and breath sounds without rales or wheezing  Abdominal: Soft. Bowel sounds are normal. NT. No HSM  Musculoskeletal: Normal range of motion. Exhibits no edema Lymphadenopathy: Has no other cervical adenopathy.  Neurological: Pt is alert and oriented to person, place, and time. Pt has normal reflexes. No cranial nerve deficit. Motor grossly intact, Gait intact, decreased sens to LT to distal LE's Skin: Skin is warm and dry. No rash noted or new ulcerations Psychiatric:  Has normal mood and affect. Behavior is normal without agitation        Assessment & Plan:

## 2017-02-16 NOTE — Patient Instructions (Addendum)
You had the Pneumovax pneumonia shot  Please schedule the bone density test before leaving today at the scheduling desk (where you check out)  Please continue all other medications as before, and refills have been done if requested.  Please have the pharmacy call with any other refills you may need.  Please continue your efforts at being more active, low cholesterol diet, and weight control.  You are otherwise up to date with prevention measures today.  Please keep your appointments with your specialists as you may have planned  Please go to the LAB in the Basement (turn left off the elevator) for the tests to be done today  You will be contacted by phone if any changes need to be made immediately.  Otherwise, you will receive a letter about your results with an explanation, but please check with MyChart first.  Please remember to sign up for MyChart if you have not done so, as this will be important to you in the future with finding out test results, communicating by private email, and scheduling acute appointments online when needed.  If you have Medicare related insurance (such as traditional Medicare, Blue Cross Medicare or [employer] Medicare, [employer] similar), Please make an appointment at the [facility] desk with Noreene LarssonJill, the [facility], for your Wellness Visit in this office, which is a benefit with your insurance.  Please return in 1 year for your yearly visit, or sooner if needed, with Lab testing done 3-5 days before

## 2017-02-17 ENCOUNTER — Telehealth: Payer: Self-pay | Admitting: Internal Medicine

## 2017-02-17 ENCOUNTER — Encounter: Payer: Self-pay | Admitting: Internal Medicine

## 2017-02-17 LAB — TSH: TSH: 1.81 u[IU]/mL (ref 0.35–4.50)

## 2017-02-17 LAB — URINALYSIS, ROUTINE W REFLEX MICROSCOPIC
BILIRUBIN URINE: NEGATIVE
HGB URINE DIPSTICK: NEGATIVE
Ketones, ur: NEGATIVE
NITRITE: NEGATIVE
RBC / HPF: NONE SEEN (ref 0–?)
Specific Gravity, Urine: 1.02 (ref 1.000–1.030)
Total Protein, Urine: NEGATIVE
UROBILINOGEN UA: 0.2 (ref 0.0–1.0)
Urine Glucose: NEGATIVE
pH: 5.5 (ref 5.0–8.0)

## 2017-02-17 LAB — VITAMIN B12: Vitamin B-12: 482 pg/mL (ref 211–911)

## 2017-02-17 MED ORDER — LISINOPRIL 20 MG PO TABS: 20.0000 mg | ORAL_TABLET | Freq: Every day | ORAL | 0 refills | Status: DC

## 2017-02-17 NOTE — Telephone Encounter (Signed)
Patient has appt yesterday.  States that lisinopril did not get set to the pharmacy.  States she uses [facility] on [address].  This should be primary pharmacy now.   Patient states she had a few refills that were sent in that she states she mentioned to remove from med list b/c she no longer takes.   Patient is requesting fluocinonide and desoximetasone to be take off.  Patient states she no longer uses these creams.

## 2017-02-17 NOTE — Telephone Encounter (Signed)
done

## 2017-02-18 DIAGNOSIS — G629 Polyneuropathy, unspecified: Secondary | ICD-10-CM | POA: Insufficient documentation

## 2017-02-18 NOTE — Assessment & Plan Note (Signed)
Also for b12 level today,  to f/u any worsening symptoms or concerns

## 2017-02-23 ENCOUNTER — Ambulatory Visit (INDEPENDENT_AMBULATORY_CARE_PROVIDER_SITE_OTHER)
Admission: RE | Admit: 2017-02-23 | Discharge: 2017-02-23 | Disposition: A | Discharge status: Home / Self Care | Payer: Medicare Other | Source: Ambulatory Visit | Attending: Internal Medicine | Admitting: Internal Medicine

## 2017-02-23 DIAGNOSIS — E2839 Other primary ovarian failure: Secondary | ICD-10-CM | POA: Diagnosis not present

## 2017-02-28 ENCOUNTER — Encounter: Payer: Self-pay | Admitting: Internal Medicine

## 2017-05-02 LAB — HM MAMMOGRAPHY

## 2017-05-23 ENCOUNTER — Other Ambulatory Visit: Payer: Self-pay | Admitting: Internal Medicine

## 2017-08-12 IMAGING — DX DG CHEST 2V
2 series · 2 of 2 positions shown · non-contrast
Comparison: None.

CLINICAL DATA: Dyspnea for 1 month.  Hypertension.

EXAM:
CHEST - 2 VIEW

[chest pa]
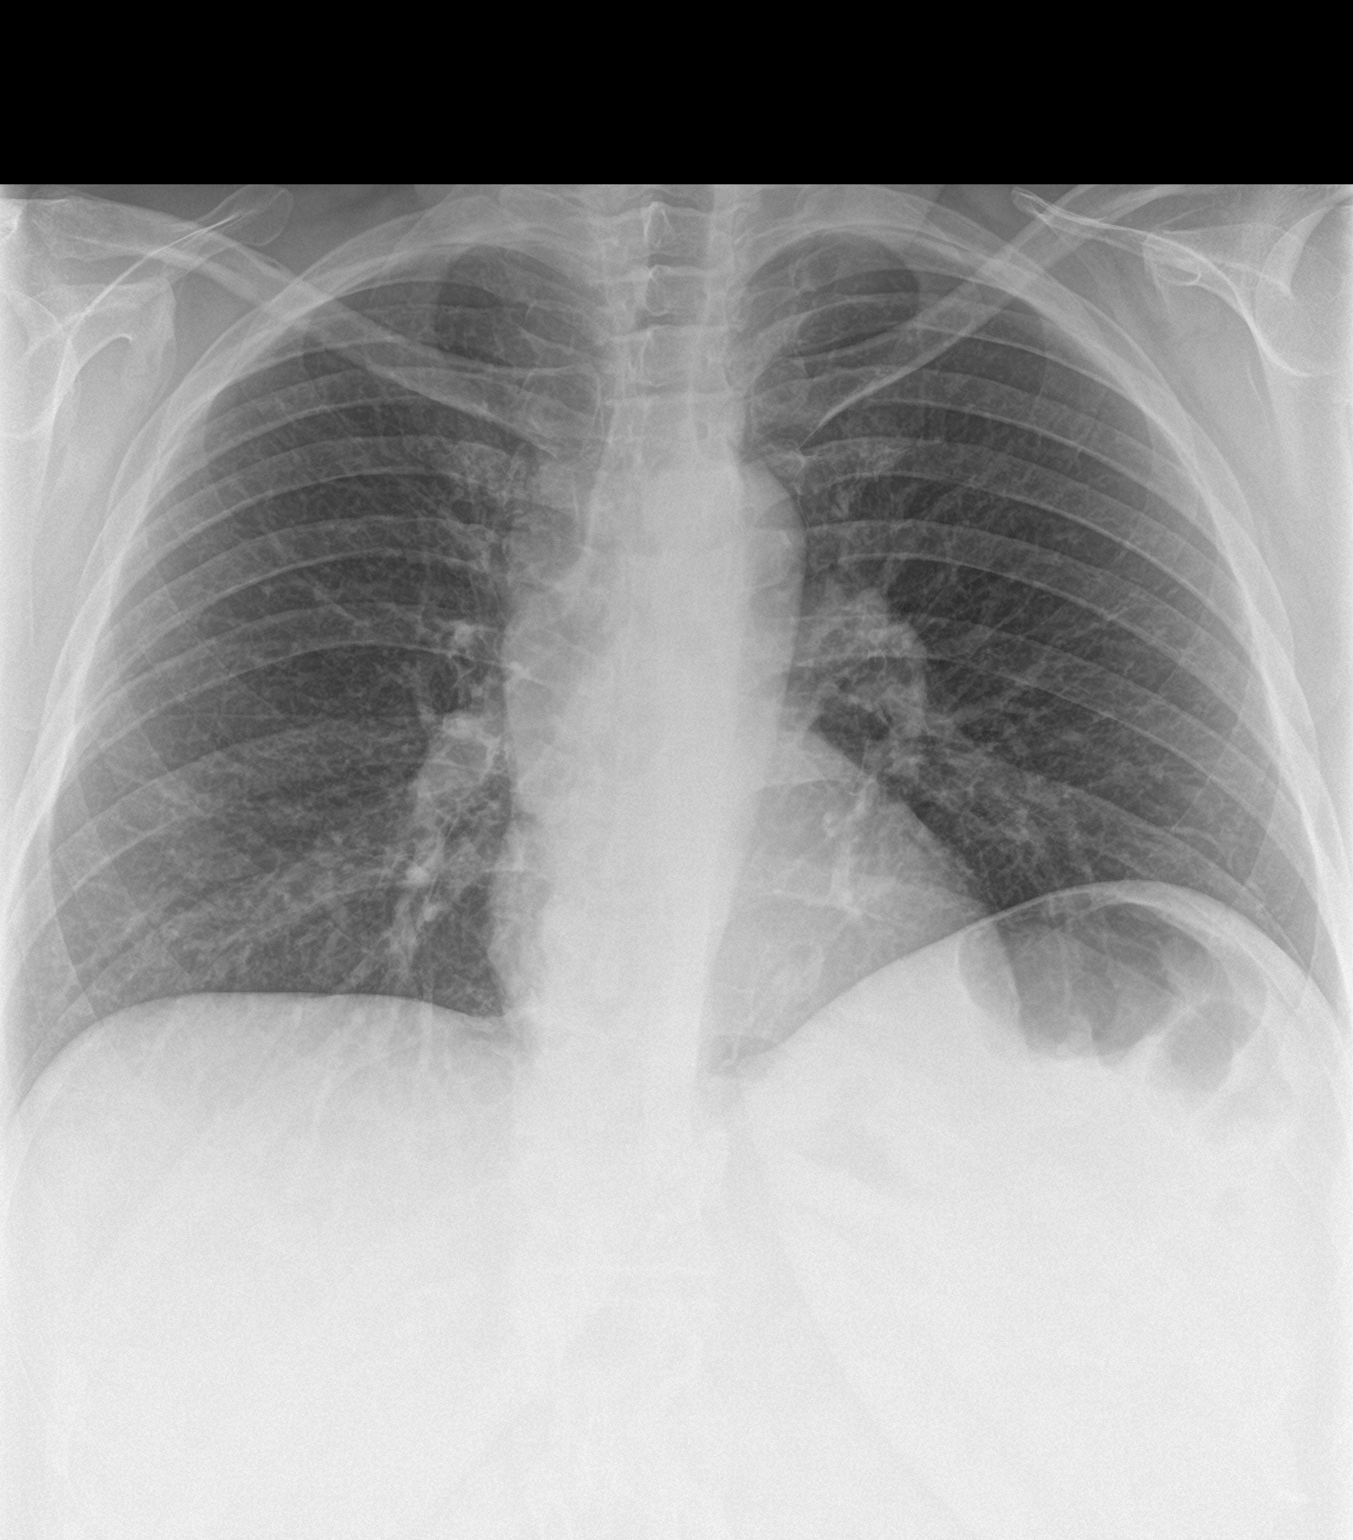

[chest lat]
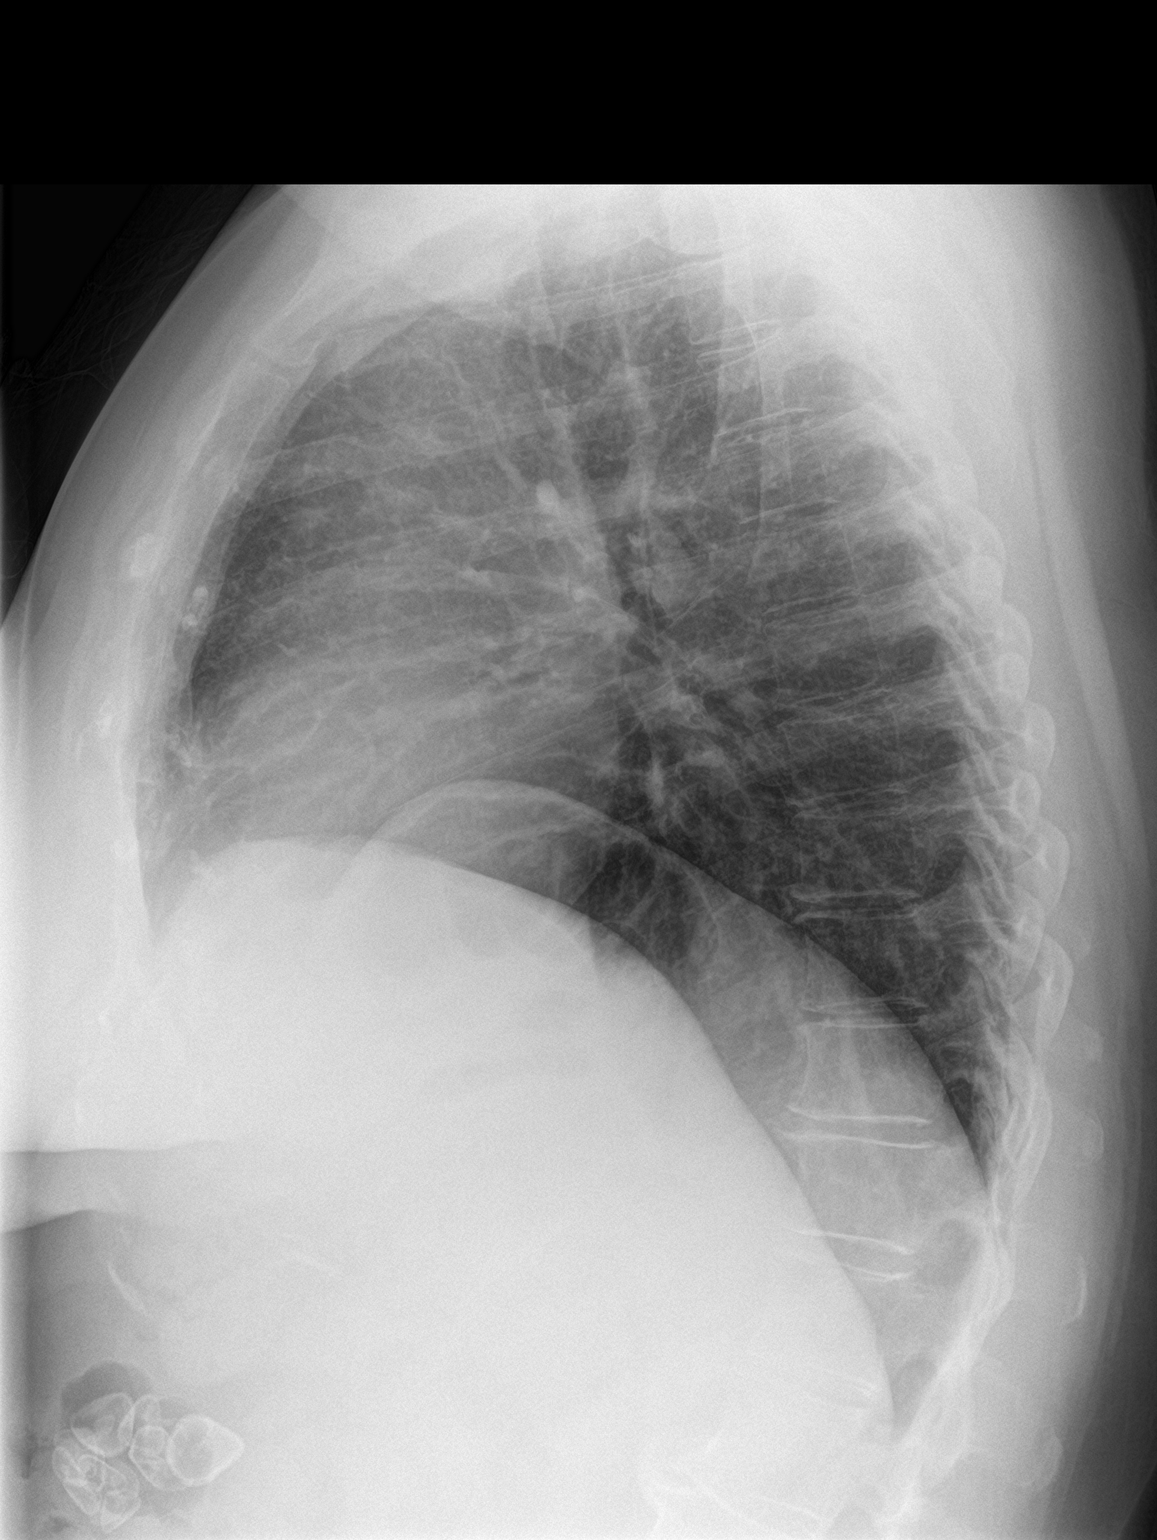

[2 of 2 positions shown; findings below may reference images not displayed]

FINDINGS: The heart size and mediastinal contours are within normal limits.
Both lungs are clear. The visualized skeletal structures are
unremarkable.
IMPRESSION: Negative two view chest x-ray

## 2018-02-17 ENCOUNTER — Other Ambulatory Visit: Payer: Self-pay | Admitting: Internal Medicine

## 2018-02-17 ENCOUNTER — Ambulatory Visit (INDEPENDENT_AMBULATORY_CARE_PROVIDER_SITE_OTHER): Payer: Medicare Other | Admitting: Internal Medicine

## 2018-02-17 ENCOUNTER — Other Ambulatory Visit (INDEPENDENT_AMBULATORY_CARE_PROVIDER_SITE_OTHER): Payer: Medicare Other

## 2018-02-17 ENCOUNTER — Encounter: Payer: Self-pay | Admitting: Internal Medicine

## 2018-02-17 VITALS — BP 122/70 | HR 84 | Temp 97.5°F | Ht 68.0 in | Wt 250.0 lb

## 2018-02-17 DIAGNOSIS — N183 Chronic kidney disease, stage 3 unspecified: Secondary | ICD-10-CM

## 2018-02-17 DIAGNOSIS — Z0001 Encounter for general adult medical examination with abnormal findings: Secondary | ICD-10-CM

## 2018-02-17 DIAGNOSIS — L309 Dermatitis, unspecified: Secondary | ICD-10-CM | POA: Diagnosis not present

## 2018-02-17 DIAGNOSIS — Z23 Encounter for immunization: Secondary | ICD-10-CM | POA: Diagnosis not present

## 2018-02-17 DIAGNOSIS — J309 Allergic rhinitis, unspecified: Secondary | ICD-10-CM | POA: Diagnosis not present

## 2018-02-17 DIAGNOSIS — Z0000 Encounter for general adult medical examination without abnormal findings: Secondary | ICD-10-CM | POA: Diagnosis not present

## 2018-02-17 DIAGNOSIS — I10 Essential (primary) hypertension: Secondary | ICD-10-CM | POA: Diagnosis not present

## 2018-02-17 DIAGNOSIS — J4530 Mild persistent asthma, uncomplicated: Secondary | ICD-10-CM

## 2018-02-17 DIAGNOSIS — J45909 Unspecified asthma, uncomplicated: Secondary | ICD-10-CM | POA: Insufficient documentation

## 2018-02-17 LAB — LDL CHOLESTEROL, DIRECT: LDL DIRECT: 84 mg/dL

## 2018-02-17 LAB — BASIC METABOLIC PANEL
BUN: 33 mg/dL — AB (ref 6–23)
CHLORIDE: 103 meq/L (ref 96–112)
CO2: 27 mEq/L (ref 19–32)
CREATININE: 1.69 mg/dL — AB (ref 0.40–1.20)
Calcium: 9.9 mg/dL (ref 8.4–10.5)
GFR: 31.91 mL/min — ABNORMAL LOW (ref >60.00)
GLUCOSE: 102 mg/dL — AB (ref 70–99)
POTASSIUM: 4.2 meq/L (ref 3.5–5.1)
Sodium: 137 mEq/L (ref 135–145)

## 2018-02-17 LAB — LIPID PANEL
Cholesterol: 171 mg/dL (ref 0–200)
HDL: 56.7 mg/dL (ref >39.00)
NonHDL: 114.46
Total CHOL/HDL Ratio: 3
Triglycerides: 222 mg/dL — ABNORMAL HIGH (ref 0.0–149.0)
VLDL: 44.4 mg/dL — AB (ref 0.0–40.0)

## 2018-02-17 LAB — CBC WITH DIFFERENTIAL/PLATELET
Basophils Absolute: 0.1 10*3/uL (ref 0.0–0.1)
Basophils Relative: 1.4 % (ref 0.0–3.0)
EOS ABS: 0.5 10*3/uL (ref 0.0–0.7)
Eosinophils Relative: 5.3 % — ABNORMAL HIGH (ref 0.0–5.0)
HCT: 33.1 % — ABNORMAL LOW (ref 36.0–46.0)
Hemoglobin: 11.7 g/dL — ABNORMAL LOW (ref 12.0–15.0)
Lymphocytes Relative: 22.7 % (ref 12.0–46.0)
Lymphs Abs: 2.2 10*3/uL (ref 0.7–4.0)
MCHC: 35.5 g/dL (ref 30.0–36.0)
MCV: 95.1 fl (ref 78.0–100.0)
MONO ABS: 0.5 10*3/uL (ref 0.1–1.0)
Monocytes Relative: 5.1 % (ref 3.0–12.0)
Neutro Abs: 6.3 10*3/uL (ref 1.4–7.7)
Neutrophils Relative %: 65.5 % (ref 43.0–77.0)
Platelets: 251 10*3/uL (ref 150.0–400.0)
RBC: 3.48 Mil/uL — AB (ref 3.87–5.11)
RDW: 15.4 % (ref 11.5–15.5)
WBC: 9.6 10*3/uL (ref 4.0–10.5)

## 2018-02-17 LAB — URINALYSIS, ROUTINE W REFLEX MICROSCOPIC
BILIRUBIN URINE: NEGATIVE
Hgb urine dipstick: NEGATIVE
KETONES UR: NEGATIVE
Nitrite: NEGATIVE
RBC / HPF: NONE SEEN (ref 0–?)
Specific Gravity, Urine: 1.02 (ref 1.000–1.030)
Total Protein, Urine: NEGATIVE
Urine Glucose: NEGATIVE
Urobilinogen, UA: 0.2 (ref 0.0–1.0)
pH: 5.5 (ref 5.0–8.0)

## 2018-02-17 LAB — TSH: TSH: 1.49 u[IU]/mL (ref 0.35–4.50)

## 2018-02-17 LAB — HEPATIC FUNCTION PANEL
ALT: 26 U/L (ref 0–35)
AST: 28 U/L (ref 0–37)
Albumin: 4.6 g/dL (ref 3.5–5.2)
Alkaline Phosphatase: 57 U/L (ref 39–117)
BILIRUBIN DIRECT: 0.1 mg/dL (ref 0.0–0.3)
BILIRUBIN TOTAL: 0.7 mg/dL (ref 0.2–1.2)
Total Protein: 7.3 g/dL (ref 6.0–8.3)

## 2018-02-17 MED ORDER — ALBUTEROL SULFATE HFA 108 (90 BASE) MCG/ACT IN AERS: 2.0000 | INHALATION_SPRAY | Freq: Four times a day (QID) | RESPIRATORY_TRACT | 5 refills | Status: DC | PRN

## 2018-02-17 MED ORDER — HYDROCHLOROTHIAZIDE 25 MG PO TABS: 25.0000 mg | ORAL_TABLET | Freq: Every day | ORAL | 3 refills | Status: DC

## 2018-02-17 MED ORDER — LISINOPRIL 20 MG PO TABS: ORAL_TABLET | ORAL | 3 refills | Status: DC

## 2018-02-17 MED ORDER — CLOBETASOL PROPIONATE 0.05 % EX CREA: 1.0000 "application " | TOPICAL_CREAM | Freq: Two times a day (BID) | CUTANEOUS | 1 refills | Status: DC

## 2018-02-17 MED ORDER — BUDESONIDE-FORMOTEROL FUMARATE 160-4.5 MCG/ACT IN AERO: 2.0000 | INHALATION_SPRAY | Freq: Two times a day (BID) | RESPIRATORY_TRACT | 3 refills | Status: DC

## 2018-02-17 NOTE — Patient Instructions (Addendum)
You had the Pneumovax pneumonia shot today  Please take all new medication as prescribed - the cream, and the symbicort   Please continue all other medications as before, and refills have been done if requested.  Please have the pharmacy call with any other refills you may need.  Please continue your efforts at being more active, low cholesterol diet, and weight control.  You are otherwise up to date with prevention measures today.  Please keep your appointments with your specialists as you may have planned  Please go to the LAB in the Basement (turn left off the elevator) for the tests to be done today  You will be contacted by phone if any changes need to be made immediately.  Otherwise, you will receive a letter about your results with an explanation, but please check with MyChart first.  Please remember to sign up for MyChart if you have not done so, as this will be important to you in the future with finding out test results, communicating by private email, and scheduling acute appointments online when needed.  Please return in 1 year for your yearly visit, or sooner if needed, with Lab testing done 3-5 days before

## 2018-02-17 NOTE — Assessment & Plan Note (Signed)
stable overall by history and exam, recent data reviewed with pt, and pt to continue medical treatment as before,  to f/u any worsening symptoms or concerns  

## 2018-02-17 NOTE — Progress Notes (Signed)
Subjective:    Patient ID: [name] [name], female    DOB: 04-Oct-1950, 67 y.o.   MRN: [id number]  HPI  Here for wellness and f/u;  Overall doing ok;  Pt denies Chest pain, worsening SOB, DOE, wheezing, orthopnea, PND, worsening LE edema, palpitations, dizziness or syncope.  Pt denies neurological change such as new headache, facial or extremity weakness.  Pt denies polydipsia, polyuria, or low sugar symptoms. Pt states overall good compliance with treatment and medications, good tolerability, and has been trying to follow appropriate diet.  Pt denies worsening depressive symptoms, suicidal ideation or panic. No fever, night sweats, wt loss, loss of appetite, or other constitutional symptoms.  Pt states good ability with ADL's, has low fall risk, home safety reviewed and adequate, no other significant changes in hearing or vision, and not active with exercise.  Asks for refill steroid cream for psoriasis which has been 1 mo more active, constant, mild itching, nothing makes better or worse.  Also with 1 mo worsening sob/doe with wheezing worse at night and wakes her up, intermittent, mild, nothing makes better or worse.  Does have several wks ongoing nasal allergy symptoms with clearish congestion, itch and sneezing, without fever, pain, ST, cough, swelling or wheezing. Wt Readings from Last 3 Encounters:  02/17/18 250 lb (113.4 kg)  02/16/17 247 lb (112 kg)  12/11/15 242 lb (109.8 kg)   BP Readings from Last 3 Encounters:  02/17/18 122/70  02/16/17 138/86  12/11/15 138/80   Past Medical History:  Diagnosis Date  . Arthritis    knee  . Eczema 10/16/2014  . Hypertension   . Obesity    Past Surgical History:  Procedure Laterality Date  . ANAL FISSURE REPAIR  1998  . ARTHROSCOPIC REPAIR ACL Right 2010  . DIAGNOSTIC MAMMOGRAM  2013  . MYOMECTOMY  1985  . TOTAL ABDOMINAL HYSTERECTOMY  1995    reports that she has never smoked. She has never used smokeless tobacco. She reports that she  does not drink alcohol or use drugs. family history includes COPD in her father; Coronary artery disease in her mother; Heart disease in her maternal grandfather; Hyperlipidemia in her mother; Hypertension in her maternal grandmother and mother; Lupus in her mother; Stroke in her maternal grandmother, paternal grandfather, and paternal grandmother. Allergies  Allergen Reactions  . Indomethacin Other (See Comments)    The reaction is more of an intolerance. Patient states that she has nausea, dizziness and agitation.    Current Outpatient Medications on File Prior to Visit  Medication Sig Dispense Refill  . aspirin 81 MG tablet Take 81 mg by mouth daily.      . naproxen sodium (ANAPROX) 220 MG tablet Take 220 mg by mouth every morning.     No current facility-administered medications on file prior to visit.    Review of Systems  Constitutional: Negative for other unusual diaphoresis or sweats HENT: Negative for ear discharge or swelling Eyes: Negative for other worsening visual disturbances Respiratory: Negative for stridor or other swelling  Gastrointestinal: Negative for worsening distension or other blood Genitourinary: Negative for retention or other urinary change Musculoskeletal: Negative for other MSK pain or swelling Skin: Negative for color change or other new lesions Neurological: Negative for worsening tremors and other numbness  Psychiatric/Behavioral: Negative for worsening agitation or other fatigue All other system neg per pt    Objective:   Physical Exam BP 122/70 (BP Location: Left Arm, Patient Position: Sitting, Cuff Size: Large)   Pulse 84  Temp (!) 97.5 F (36.4 C) (Oral)   Ht 5\' 8"  (1.727 m)   Wt 250 lb (113.4 kg)   SpO2 98%   BMI 38.01 kg/m  VS noted, morbid obese Constitutional: Pt is oriented to person, place, and time. Appears well-developed and well-nourished, in no significant distress and comfortable Head: Normocephalic and atraumatic  Eyes:  Conjunctivae and EOM are normal. Pupils are equal, round, and reactive to light Right Ear: External ear normal without discharge Left Ear: External ear normal without discharge Nose: Nose without discharge or deformity Mouth/Throat: Oropharynx is without other ulcerations and moist  Neck: Normal range of motion. Neck supple. No JVD present. No tracheal deviation present or significant neck LA or mass Cardiovascular: Normal rate, regular rhythm, normal heart sounds and intact distal pulses.   Pulmonary/Chest: WOB normal and breath sounds decreased without rales or wheezing  Abdominal: Soft. Bowel sounds are normal. NT. No HSM  Musculoskeletal: Normal range of motion. Exhibits no edema Lymphadenopathy: Has no other cervical adenopathy.  Neurological: Pt is alert and oriented to person, place, and time. Pt has normal reflexes. No cranial nerve deficit. Motor grossly intact, Gait intact Skin: Skin is warm and dry. + nontender erythem rash noted to all distal extermities, no new ulcerations Psychiatric:  Has normal mood and affect. Behavior is normal without agitation No other exam findings Lab Results  Component Value Date   WBC 9.6 02/17/2018   HGB 11.7 (L) 02/17/2018   HCT 33.1 (L) 02/17/2018   PLT 251.0 02/17/2018   GLUCOSE 102 (H) 02/17/2018   CHOL 171 02/17/2018   TRIG 222.0 (H) 02/17/2018   HDL 56.70 02/17/2018   LDLDIRECT 84.0 02/17/2018   LDLCALC 84 02/16/2017   ALT 26 02/17/2018   AST 28 02/17/2018   NA 137 02/17/2018   K 4.2 02/17/2018   CL 103 02/17/2018   CREATININE 1.69 (H) 02/17/2018   BUN 33 (H) 02/17/2018   CO2 27 02/17/2018   TSH 1.49 02/17/2018         Assessment & Plan:

## 2018-02-17 NOTE — Assessment & Plan Note (Signed)

## 2018-02-17 NOTE — Assessment & Plan Note (Signed)
For otc allegra and nasaocrt asd

## 2018-02-17 NOTE — Assessment & Plan Note (Signed)
Mild to mod, for symbicort asd, to f/u any worsening symptoms or concerns

## 2018-02-17 NOTE — Assessment & Plan Note (Addendum)
Ok for clobetasol cr asd,  to f/u any worsening symptoms or concerns  Note:  Total time for pt hx, exam, review of record with pt in the room, determination of diagnoses and plan for further eval and tx is > 40 min, with over 50% spent in coordination and counseling of patient including the differential dx, tx, further evaluation and other management of eczema, allergic rhinitis, asthma, ckd, htn

## 2018-02-17 NOTE — Assessment & Plan Note (Signed)
For f/u lab, consider renal referral, may need to d/c ace

## 2018-02-20 ENCOUNTER — Telehealth: Payer: Self-pay

## 2018-02-20 NOTE — Telephone Encounter (Signed)
-----   Message from Corwin LevinsJames W John, MD sent at 02/17/2018  9:04 PM EDT ----- Letter sent, cont same tx except  The test results show that your current treatment is OK, except the kidney function has gotten somewhat worse.  We should refer you to Nephrology (kidney) doctor for further consideration.  I will place the referral, and you should hopefully hear soon.Lendell Caprice.  Shirron to please inform pt, I will do referral

## 2018-02-20 NOTE — Telephone Encounter (Signed)
Pt has been informed and expressed understanding.  

## 2018-04-18 DIAGNOSIS — M25579 Pain in unspecified ankle and joints of unspecified foot: Secondary | ICD-10-CM | POA: Insufficient documentation

## 2018-05-10 ENCOUNTER — Other Ambulatory Visit: Payer: Self-pay | Admitting: Nephrology

## 2018-05-10 DIAGNOSIS — N183 Chronic kidney disease, stage 3 unspecified: Secondary | ICD-10-CM

## 2018-05-10 DIAGNOSIS — I129 Hypertensive chronic kidney disease with stage 1 through stage 4 chronic kidney disease, or unspecified chronic kidney disease: Secondary | ICD-10-CM

## 2018-05-12 ENCOUNTER — Other Ambulatory Visit: Payer: Medicare Other

## 2018-05-17 ENCOUNTER — Ambulatory Visit
Admission: RE | Admit: 2018-05-17 | Discharge: 2018-05-17 | Disposition: A | Discharge status: Home / Self Care | Payer: Medicare Other | Source: Ambulatory Visit | Attending: Nephrology | Admitting: Nephrology

## 2018-05-17 DIAGNOSIS — I129 Hypertensive chronic kidney disease with stage 1 through stage 4 chronic kidney disease, or unspecified chronic kidney disease: Secondary | ICD-10-CM

## 2018-05-17 DIAGNOSIS — N183 Chronic kidney disease, stage 3 unspecified: Secondary | ICD-10-CM

## 2018-05-17 LAB — HM MAMMOGRAPHY

## 2018-05-19 ENCOUNTER — Encounter: Payer: Self-pay | Admitting: Podiatry

## 2018-05-19 ENCOUNTER — Ambulatory Visit (INDEPENDENT_AMBULATORY_CARE_PROVIDER_SITE_OTHER): Payer: Medicare Other

## 2018-05-19 ENCOUNTER — Ambulatory Visit: Payer: Medicare Other | Admitting: Podiatry

## 2018-05-19 ENCOUNTER — Encounter

## 2018-05-19 DIAGNOSIS — G8929 Other chronic pain: Secondary | ICD-10-CM

## 2018-05-19 DIAGNOSIS — M7670 Peroneal tendinitis, unspecified leg: Secondary | ICD-10-CM

## 2018-05-19 DIAGNOSIS — M79673 Pain in unspecified foot: Secondary | ICD-10-CM | POA: Diagnosis not present

## 2018-05-19 DIAGNOSIS — M1000 Idiopathic gout, unspecified site: Secondary | ICD-10-CM

## 2018-05-19 DIAGNOSIS — M779 Enthesopathy, unspecified: Secondary | ICD-10-CM | POA: Diagnosis not present

## 2018-05-19 NOTE — Progress Notes (Signed)
Subjective:    Patient ID: [name] [name], female    DOB: 1951/05/27, 67 y.o.   MRN: [id number]  HPI 67 year old female presents the office today for 2 concerns.  First off she states that she has had gout on and off for the last 4 years and she has been on medication for gout but she is asking if there is anything she can be on to help prevent gout attacks.  She recently just had a bout of gout and she had seen orthopedics and she was on 2 rounds of steroids which seem to help.  She also states that over the last 2 months intermittently she is been having pain to both of her feet in general.  She points more to the also aspect of the foot where she gets tenderness and she states that at times it goes up the Achilles tendon.  She denies any recent injury or trauma to her feet she denies any swelling currently but she has noticed some peeling skin that she states that there was "fever" in the feet but she thinks this was related to the gout.  She also states that she feels that there is something "bunched up" in the ball of her feet at times but she denies any significant numbness or tingling or any burning to her feet.   Review of Systems  All other systems reviewed and are negative.  Past Medical History:  Diagnosis Date  . Arthritis    knee  . Eczema 10/16/2014  . Hypertension   . Obesity     Past Surgical History:  Procedure Laterality Date  . ANAL FISSURE REPAIR  1998  . ARTHROSCOPIC REPAIR ACL Right 2010  . DIAGNOSTIC MAMMOGRAM  2013  . MYOMECTOMY  1985  . TOTAL ABDOMINAL HYSTERECTOMY  1995     Current Outpatient Medications:  .  albuterol (PROVENTIL HFA;VENTOLIN HFA) 108 (90 Base) MCG/ACT inhaler, Inhale 2 puffs into the lungs every 6 (six) hours as needed for wheezing or shortness of breath., Disp: 1 Inhaler, Rfl: 5 .  aspirin 81 MG tablet, Take 81 mg by mouth daily.  , Disp: , Rfl:  .  budesonide-formoterol (SYMBICORT) 160-4.5 MCG/ACT inhaler, Inhale 2 puffs into the  lungs 2 (two) times daily., Disp: 3 Inhaler, Rfl: 3 .  clobetasol cream (TEMOVATE) 0.05 %, Apply 1 application topically 2 (two) times daily., Disp: 30 g, Rfl: 1 .  hydrochlorothiazide (HYDRODIURIL) 25 MG tablet, Take 1 tablet (25 mg total) by mouth daily., Disp: 90 tablet, Rfl: 3 .  lisinopril (PRINIVIL,ZESTRIL) 20 MG tablet, TAKE 1 TABLET(20 MG) BY MOUTH DAILY, Disp: 90 tablet, Rfl: 3 .  naproxen sodium (ANAPROX) 220 MG tablet, Take 220 mg by mouth every morning., Disp: , Rfl:   Allergies  Allergen Reactions  . Indomethacin Other (See Comments)    The reaction is more of an intolerance. Patient states that she has nausea, dizziness and agitation.         Objective:   Physical Exam General: AAO x3, NAD  Dermatological: There is some peeling skin present along the first metatarsophalangeal joint and I think this is from the resolving edema that she had but there is currently no erythema or increase in warmth there is no clinical signs of gout identified at this time.  Vascular: Dorsalis Pedis artery and Posterior Tibial artery pedal pulses are 2/4 bilateral with immedate capillary fill time. There is no pain with calf compression, swelling, warmth, erythema.   Neruologic: Grossly intact via  light touch bilateral.  Protective threshold with Semmes Wienstein monofilament intact to all pedal sites bilateral.   Musculoskeletal: There is mild tenderness palpation along the distal portion of the peroneal tendons along insertion into the fifth metatarsal base.  Peroneal tendons appear to be intact.  No pain on the Achilles tendon and this appears to be intact as well.  There is no edema to the foot currently there is no area pinpoint bony tenderness or pain to vibratory sensation.  Decreased medial arch height.  Muscular strength 5/5 in all groups tested bilateral.  Gait: Unassisted, Nonantalgic.     Assessment & Plan:  67 year old female with history of multiple gout attacks, peroneal  tendinitis -Treatment options discussed including all alternatives, risks, and complications -Etiology of symptoms were discussed -X-rays were obtained and reviewed with the patient.  No evidence of acute fracture or stress fracture.  Arthritic changes present of the first MPJ. -Response to gout she is interested in allopurinol.  I will try to get her uric acid levels that she is at this checked if not I did give her lab work if this done by me to call her if I get the results of her other blood work from her other doctors.  Also we need to discuss with nephrology regards to her kidney disease gout treatments. -Regards to the foot pain I do think she has tendinitis and more along the peroneal tendon.  She also has some other areas of pain to her feet but on exam today this is where the majority of tenderness is.  Discussed shoe modifications, ice the area as well.  Do think she needs orthotics and she discussed getting those with me as well.  I will have her follow-up with Raiford Noble for inserts.  We also discussed shoe gear modifications.  Vivi Barrack DPM

## 2018-05-22 ENCOUNTER — Other Ambulatory Visit: Payer: Self-pay | Admitting: Podiatry

## 2018-05-22 DIAGNOSIS — M7670 Peroneal tendinitis, unspecified leg: Secondary | ICD-10-CM

## 2018-05-30 ENCOUNTER — Ambulatory Visit: Payer: Medicare Other | Admitting: Orthotics

## 2018-05-30 DIAGNOSIS — M1000 Idiopathic gout, unspecified site: Secondary | ICD-10-CM

## 2018-05-30 DIAGNOSIS — M7670 Peroneal tendinitis, unspecified leg: Secondary | ICD-10-CM

## 2018-05-30 DIAGNOSIS — M79673 Pain in unspecified foot: Secondary | ICD-10-CM

## 2018-05-30 DIAGNOSIS — M7671 Peroneal tendinitis, right leg: Secondary | ICD-10-CM

## 2018-05-30 DIAGNOSIS — M7672 Peroneal tendinitis, left leg: Secondary | ICD-10-CM | POA: Diagnosis not present

## 2018-05-30 DIAGNOSIS — G8929 Other chronic pain: Secondary | ICD-10-CM

## 2018-05-30 NOTE — Progress Notes (Signed)
Orthotic note: Deep heel cup, met head cut out shell 5th, 1/8" lift.  Valgus wedge ff 3*, k-wedge under 1st MPJ.  Offload 5th met base.

## 2018-06-20 ENCOUNTER — Ambulatory Visit (INDEPENDENT_AMBULATORY_CARE_PROVIDER_SITE_OTHER): Payer: Medicare Other | Admitting: Orthotics

## 2018-06-20 DIAGNOSIS — M7670 Peroneal tendinitis, unspecified leg: Secondary | ICD-10-CM

## 2018-06-20 NOTE — Progress Notes (Signed)
Patient came in today to pick up custom made foot orthotics.  The goals were accomplished and the patient reported no dissatisfaction with said orthotics.  Patient was advised of breakin period and how to report any issues. 

## 2018-06-28 ENCOUNTER — Ambulatory Visit: Payer: Medicare Other | Admitting: Orthotics

## 2018-06-28 DIAGNOSIS — M7670 Peroneal tendinitis, unspecified leg: Secondary | ICD-10-CM

## 2018-06-28 DIAGNOSIS — M1000 Idiopathic gout, unspecified site: Secondary | ICD-10-CM

## 2018-06-28 NOTE — Progress Notes (Signed)
Adjusted f/o by lowering arch R and skiving heel to fit better in shoes.

## 2018-07-04 ENCOUNTER — Telehealth: Payer: Self-pay | Admitting: *Deleted

## 2018-07-04 NOTE — Telephone Encounter (Signed)
I informed pt Dr. Ardelle AntonWagoner was waiting for the uric acid result, before planning her treatment. Pt states she doesn't think she needs to have this done, her rheumatologist is managing it with allopurinol.

## 2018-07-04 NOTE — Telephone Encounter (Signed)
-----   Message from Vivi BarrackMatthew R Wagoner, DPM sent at 07/04/2018  7:04 AM EST ----- Val- I looked over her lab work that was sent to us and I do not see a uric acid level. Due to this I had ordered a uric acid level when I saw her. Since it has not been done from what I can tell can you have her get that done and we can determine long term treatments for chronic gout? Thanks.

## 2018-07-04 NOTE — Telephone Encounter (Signed)
Ok. Thanks!

## 2018-08-24 ENCOUNTER — Other Ambulatory Visit: Payer: Self-pay | Admitting: Rheumatology

## 2018-08-24 DIAGNOSIS — R945 Abnormal results of liver function studies: Principal | ICD-10-CM

## 2018-08-24 DIAGNOSIS — R7989 Other specified abnormal findings of blood chemistry: Secondary | ICD-10-CM

## 2018-09-05 ENCOUNTER — Other Ambulatory Visit: Payer: Self-pay | Admitting: Rheumatology

## 2018-09-05 ENCOUNTER — Ambulatory Visit
Admission: RE | Admit: 2018-09-05 | Discharge: 2018-09-05 | Disposition: A | Discharge status: Home / Self Care | Payer: Medicare Other | Source: Ambulatory Visit | Attending: Rheumatology | Admitting: Rheumatology

## 2018-09-05 DIAGNOSIS — R7989 Other specified abnormal findings of blood chemistry: Secondary | ICD-10-CM

## 2018-09-05 DIAGNOSIS — R945 Abnormal results of liver function studies: Principal | ICD-10-CM

## 2019-02-10 ENCOUNTER — Other Ambulatory Visit: Payer: Self-pay | Admitting: Internal Medicine

## 2019-02-20 ENCOUNTER — Encounter: Payer: Self-pay | Admitting: Internal Medicine

## 2019-02-20 ENCOUNTER — Other Ambulatory Visit: Payer: Self-pay

## 2019-02-20 ENCOUNTER — Ambulatory Visit (INDEPENDENT_AMBULATORY_CARE_PROVIDER_SITE_OTHER): Payer: Medicare Other | Admitting: Internal Medicine

## 2019-02-20 ENCOUNTER — Other Ambulatory Visit (INDEPENDENT_AMBULATORY_CARE_PROVIDER_SITE_OTHER): Payer: Medicare Other

## 2019-02-20 VITALS — BP 128/62 | HR 94 | Temp 97.5°F | Resp 17 | Ht 68.0 in | Wt 238.0 lb

## 2019-02-20 DIAGNOSIS — E611 Iron deficiency: Secondary | ICD-10-CM | POA: Diagnosis not present

## 2019-02-20 DIAGNOSIS — Z0001 Encounter for general adult medical examination with abnormal findings: Secondary | ICD-10-CM | POA: Diagnosis not present

## 2019-02-20 DIAGNOSIS — E559 Vitamin D deficiency, unspecified: Secondary | ICD-10-CM | POA: Diagnosis not present

## 2019-02-20 DIAGNOSIS — J4530 Mild persistent asthma, uncomplicated: Secondary | ICD-10-CM | POA: Diagnosis not present

## 2019-02-20 DIAGNOSIS — R739 Hyperglycemia, unspecified: Secondary | ICD-10-CM

## 2019-02-20 DIAGNOSIS — R21 Rash and other nonspecific skin eruption: Secondary | ICD-10-CM

## 2019-02-20 DIAGNOSIS — E538 Deficiency of other specified B group vitamins: Secondary | ICD-10-CM | POA: Diagnosis not present

## 2019-02-20 DIAGNOSIS — I10 Essential (primary) hypertension: Secondary | ICD-10-CM

## 2019-02-20 DIAGNOSIS — N183 Chronic kidney disease, stage 3 unspecified: Secondary | ICD-10-CM

## 2019-02-20 LAB — HEPATIC FUNCTION PANEL
ALT: 39 U/L — ABNORMAL HIGH (ref 0–35)
AST: 54 U/L — ABNORMAL HIGH (ref 0–37)
Albumin: 4.4 g/dL (ref 3.5–5.2)
Alkaline Phosphatase: 61 U/L (ref 39–117)
Bilirubin, Direct: 0.2 mg/dL (ref 0.0–0.3)
Total Bilirubin: 0.8 mg/dL (ref 0.2–1.2)
Total Protein: 7.2 g/dL (ref 6.0–8.3)

## 2019-02-20 LAB — BASIC METABOLIC PANEL
BUN: 28 mg/dL — ABNORMAL HIGH (ref 6–23)
CO2: 25 mEq/L (ref 19–32)
Calcium: 10 mg/dL (ref 8.4–10.5)
Chloride: 103 mEq/L (ref 96–112)
Creatinine, Ser: 1.54 mg/dL — ABNORMAL HIGH (ref 0.40–1.20)
GFR: 33.32 mL/min — ABNORMAL LOW (ref >60.00)
Glucose, Bld: 101 mg/dL — ABNORMAL HIGH (ref 70–99)
Potassium: 3.9 mEq/L (ref 3.5–5.1)
Sodium: 137 mEq/L (ref 135–145)

## 2019-02-20 LAB — CBC WITH DIFFERENTIAL/PLATELET
Basophils Absolute: 0.1 10*3/uL (ref 0.0–0.1)
Basophils Relative: 2 % (ref 0.0–3.0)
Eosinophils Absolute: 0.3 10*3/uL (ref 0.0–0.7)
Eosinophils Relative: 4 % (ref 0.0–5.0)
HCT: 33.9 % — ABNORMAL LOW (ref 36.0–46.0)
Hemoglobin: 12 g/dL (ref 12.0–15.0)
Lymphocytes Relative: 25.5 % (ref 12.0–46.0)
Lymphs Abs: 1.8 10*3/uL (ref 0.7–4.0)
MCHC: 35.5 g/dL (ref 30.0–36.0)
MCV: 99.6 fl (ref 78.0–100.0)
Monocytes Absolute: 0.5 10*3/uL (ref 0.1–1.0)
Monocytes Relative: 7.1 % (ref 3.0–12.0)
Neutro Abs: 4.4 10*3/uL (ref 1.4–7.7)
Neutrophils Relative %: 61.4 % (ref 43.0–77.0)
Platelets: 136 10*3/uL — ABNORMAL LOW (ref 150.0–400.0)
RBC: 3.41 Mil/uL — ABNORMAL LOW (ref 3.87–5.11)
RDW: 15.1 % (ref 11.5–15.5)
WBC: 7.2 10*3/uL (ref 4.0–10.5)

## 2019-02-20 LAB — LIPID PANEL
Cholesterol: 179 mg/dL (ref 0–200)
HDL: 57.3 mg/dL (ref >39.00)
LDL Cholesterol: 95 mg/dL (ref 0–99)
NonHDL: 121.55
Total CHOL/HDL Ratio: 3
Triglycerides: 133 mg/dL (ref 0.0–149.0)
VLDL: 26.6 mg/dL (ref 0.0–40.0)

## 2019-02-20 LAB — VITAMIN B12: Vitamin B-12: 454 pg/mL (ref 211–911)

## 2019-02-20 LAB — VITAMIN D 25 HYDROXY (VIT D DEFICIENCY, FRACTURES): VITD: 80.87 ng/mL (ref 30.00–100.00)

## 2019-02-20 LAB — TSH: TSH: 2.14 u[IU]/mL (ref 0.35–4.50)

## 2019-02-20 LAB — IBC PANEL
Iron: 105 ug/dL (ref 42–145)
Saturation Ratios: 30.5 % (ref 20.0–50.0)
Transferrin: 246 mg/dL (ref 212.0–360.0)

## 2019-02-20 LAB — HEMOGLOBIN A1C: Hgb A1c MFr Bld: 5.3 % (ref 4.6–6.5)

## 2019-02-20 NOTE — Assessment & Plan Note (Signed)
stable overall by history and exam, recent data reviewed with pt, and pt to continue medical treatment as before,  to f/u any worsening symptoms or concerns  

## 2019-02-20 NOTE — Progress Notes (Signed)
Subjective:    Patient ID: [name] [name], female    DOB: 09/11/1950, 68 y.o.   MRN: [id number]  HPI  Here for wellness and f/u;  Overall doing ok;  Pt denies Chest pain, worsening SOB, DOE, wheezing, orthopnea, PND, worsening LE edema, palpitations, dizziness or syncope.  Pt denies neurological change such as new headache, facial or extremity weakness.  Pt denies polydipsia, polyuria, or low sugar symptoms. Pt states overall good compliance with treatment and medications, good tolerability, and has been trying to follow appropriate diet.  Pt denies worsening depressive symptoms, suicidal ideation or panic. No fever, night sweats, wt loss, loss of appetite, or other constitutional symptoms.  Pt states good ability with ADL's, has low fall risk, home safety reviewed and adequate, no other significant changes in hearing or vision, and only occasionally active with exercise.  Has been seeing rheum for persistent gout, renal for ckd 3 but stable for 6 mo f/u;  Wt Readings from Last 3 Encounters:  02/20/19 238 lb (108 kg)  02/17/18 250 lb (113.4 kg)  02/16/17 247 lb (112 kg)  Also Still has an unsual rash to distal arms and legs, sees derm but asks for second opinion to a [facility] clinic. Seems better with prednisone but keeps coming back; constant, water maybe makes it worse, drier skin does not seem to make worse. No fever.  Nothing else makes better or worse Past Medical History:  Diagnosis Date  . Arthritis    knee  . Eczema 10/16/2014  . Hypertension   . Obesity    Past Surgical History:  Procedure Laterality Date  . ANAL FISSURE REPAIR  1998  . ARTHROSCOPIC REPAIR ACL Right 2010  . DIAGNOSTIC MAMMOGRAM  2013  . MYOMECTOMY  1985  . TOTAL ABDOMINAL HYSTERECTOMY  1995    reports that she has never smoked. She has never used smokeless tobacco. She reports that she does not drink alcohol or use drugs. family history includes COPD in her father; Coronary artery disease in her mother; Heart  disease in her maternal grandfather; Hyperlipidemia in her mother; Hypertension in her maternal grandmother and mother; Lupus in her mother; Stroke in her maternal grandmother, paternal grandfather, and paternal grandmother. Allergies  Allergen Reactions  . Indomethacin Other (See Comments)    The reaction is more of an intolerance. Patient states that she has nausea, dizziness and agitation.    Current Outpatient Medications on File Prior to Visit  Medication Sig Dispense Refill  . albuterol (PROVENTIL HFA;VENTOLIN HFA) 108 (90 Base) MCG/ACT inhaler Inhale 2 puffs into the lungs every 6 (six) hours as needed for wheezing or shortness of breath. 1 Inhaler 5  . allopurinol (ZYLOPRIM) 100 MG tablet Take 2 tablets by mouth daily.    Marland Kitchen. aspirin 81 MG tablet Take 81 mg by mouth daily.      . budesonide-formoterol (SYMBICORT) 160-4.5 MCG/ACT inhaler Inhale 2 puffs into the lungs 2 (two) times daily. 3 Inhaler 3  . clobetasol cream (TEMOVATE) 0.05 % Apply 1 application topically 2 (two) times daily. 30 g 1  . Colchicine 0.6 MG CAPS Take 1 capsule by mouth daily.    . hydrochlorothiazide (HYDRODIURIL) 25 MG tablet TAKE 1 TABLET(25 MG) BY MOUTH DAILY 90 tablet 0  . lisinopril (ZESTRIL) 20 MG tablet TAKE 1 TABLET(20 MG) BY MOUTH DAILY 90 tablet 0  . naproxen sodium (ANAPROX) 220 MG tablet Take 220 mg by mouth every morning.    Marland Kitchen. VITAMIN D PO Take 5,000 Units by  mouth daily.     No current facility-administered medications on file prior to visit.    Review of Systems  Constitutional: Negative for other unusual diaphoresis or sweats HENT: Negative for ear discharge or swelling Eyes: Negative for other worsening visual disturbances Respiratory: Negative for stridor or other swelling  Gastrointestinal: Negative for worsening distension or other blood Genitourinary: Negative for retention or other urinary change Musculoskeletal: Negative for other MSK pain or swelling Skin: Negative for color change or  other new lesions Neurological: Negative for worsening tremors and other numbness  Psychiatric/Behavioral: Negative for worsening agitation or other fatigue All other system neg per pt    Objective:   Physical Exam BP 128/62   Pulse 94   Temp (!) 97.5 F (36.4 C) (Oral)   Resp 17   Ht 5\' 8"  (1.727 m)   Wt 238 lb (108 kg)   SpO2 98%   BMI 36.19 kg/m  VS noted,  Constitutional: Pt appears in NAD HENT: Head: NCAT.  Right Ear: External ear normal.  Left Ear: External ear normal.  Eyes: . Pupils are equal, round, and reactive to light. Conjunctivae and EOM are normal Nose: without d/c or deformity Neck: Neck supple. Gross normal ROM Cardiovascular: Normal rate and regular rhythm.   Pulmonary/Chest: Effort normal and breath sounds without rales or wheezing.  Abd:  Soft, NT, ND, + BS, no organomegaly Neurological: Pt is alert. At baseline orientation, motor grossly intact Skin: Skin is warm. + patchy shallow erythem rashes lesions multiple only the distal arms and leg, no other new lesions, no LE edema Psychiatric: Pt behavior is normal without agitation  No other exam findings Lab Results  Component Value Date   WBC 7.2 02/20/2019   HGB 12.0 02/20/2019   HCT 33.9 (L) 02/20/2019   PLT 136.0 (L) 02/20/2019   GLUCOSE 101 (H) 02/20/2019   CHOL 179 02/20/2019   TRIG 133.0 02/20/2019   HDL 57.30 02/20/2019   LDLDIRECT 84.0 02/17/2018   LDLCALC 95 02/20/2019   ALT 39 (H) 02/20/2019   AST 54 (H) 02/20/2019   NA 137 02/20/2019   K 3.9 02/20/2019   CL 103 02/20/2019   CREATININE 1.54 (H) 02/20/2019   BUN 28 (H) 02/20/2019   CO2 25 02/20/2019   TSH 2.14 02/20/2019   HGBA1C 5.3 02/20/2019        Assessment & Plan:

## 2019-02-20 NOTE — Patient Instructions (Addendum)
You will be contacted regarding the referral for: dermatology at [facility] as you requested  You will be contacted regarding the referral for: CT Cardiac Scoring  Please continue all other medications as before, and refills have been done if requested.  Please have the pharmacy call with any other refills you may need.  Please continue your efforts at being more active, low cholesterol diet, and weight control.  You are otherwise up to date with prevention measures today.  Please keep your appointments with your specialists as you may have planned  Please go to the LAB in the Basement (turn left off the elevator) for the tests to be done today  You will be contacted by phone if any changes need to be made immediately.  Otherwise, you will receive a letter about your results with an explanation, but please check with MyChart first.  Please remember to sign up for MyChart if you have not done so, as this will be important to you in the future with finding out test results, communicating by private email, and scheduling acute appointments online when needed.  Please return in 1 year for your yearly visit, or sooner if needed, with Lab testing done 3-5 days before

## 2019-02-20 NOTE — Assessment & Plan Note (Addendum)
OK for derm referral for likely inflammatory rash  In addition to the time spent performing CPE, I spent an additional 25 minutes face to face,in which greater than 50% of this time was spent in counseling and coordination of care for patient's acute illness as documented, including the differential dx, treatment, further evaluation and other management of rash, CKD, HTN, asthma

## 2019-02-20 NOTE — Assessment & Plan Note (Signed)

## 2019-03-21 ENCOUNTER — Encounter: Payer: Self-pay | Admitting: Internal Medicine

## 2019-03-23 ENCOUNTER — Other Ambulatory Visit: Payer: Self-pay

## 2019-03-23 ENCOUNTER — Ambulatory Visit (INDEPENDENT_AMBULATORY_CARE_PROVIDER_SITE_OTHER): Payer: Medicare Other | Admitting: Internal Medicine

## 2019-03-23 ENCOUNTER — Encounter: Payer: Self-pay | Admitting: Internal Medicine

## 2019-03-23 VITALS — BP 126/78 | HR 81 | Temp 97.9°F | Ht 68.0 in | Wt 236.0 lb

## 2019-03-23 DIAGNOSIS — R739 Hyperglycemia, unspecified: Secondary | ICD-10-CM

## 2019-03-23 DIAGNOSIS — Z23 Encounter for immunization: Secondary | ICD-10-CM | POA: Diagnosis not present

## 2019-03-23 DIAGNOSIS — I10 Essential (primary) hypertension: Secondary | ICD-10-CM | POA: Diagnosis not present

## 2019-03-23 DIAGNOSIS — R21 Rash and other nonspecific skin eruption: Secondary | ICD-10-CM

## 2019-03-23 MED ORDER — DOXYCYCLINE HYCLATE 100 MG PO TABS: 100.0000 mg | ORAL_TABLET | Freq: Two times a day (BID) | ORAL | 0 refills | Status: DC

## 2019-03-23 MED ORDER — TRIAMCINOLONE ACETONIDE 0.1 % EX CREA: 1.0000 "application " | TOPICAL_CREAM | Freq: Two times a day (BID) | CUTANEOUS | 0 refills | Status: DC

## 2019-03-23 NOTE — Patient Instructions (Signed)
Please take all new medication as prescribed - the antibiotic, and cream  Please continue all other medications as before, and refills have been done if requested.  Please have the pharmacy call with any other refills you may need.  Please continue your efforts at being more active, low cholesterol diet, and weight control  Please keep your appointments with your specialists as you may have planned

## 2019-03-23 NOTE — Assessment & Plan Note (Signed)
stable overall by history and exam, recent data reviewed with pt, and pt to continue medical treatment as before,  to f/u any worsening symptoms or concerns  

## 2019-03-23 NOTE — Assessment & Plan Note (Signed)
?   cellutis vs allergic type, for doxy course, declines prednisone trial for now, but will try triam cr prn,  to f/u any worsening symptoms or concerns

## 2019-03-23 NOTE — Progress Notes (Signed)
Subjective:    Patient ID: [name] [name], female    DOB: 1951-05-15, 68 y.o.   MRN: [id number]  HPI    Here with c/o new onset worsening right ankle red, swelling, tender and itching with some worsening extension in the last day by several cm more proximally to the medial distal leg.  No trauma, but has ongoing rash with sores and could scratched at some point, Has appt for eval at [facility] soon for this rash.  Denies fever, HA, ST, cough and Pt denies chest pain, increased sob or doe, wheezing, orthopnea, PND, increased LE swelling, palpitations, dizziness or syncope.   Pt denies polydipsia, polyuria Past Medical History:  Diagnosis Date  . Arthritis    knee  . Eczema 10/16/2014  . Hypertension   . Obesity    Past Surgical History:  Procedure Laterality Date  . ANAL FISSURE REPAIR  1998  . ARTHROSCOPIC REPAIR ACL Right 2010  . DIAGNOSTIC MAMMOGRAM  2013  . MYOMECTOMY  1985  . TOTAL ABDOMINAL HYSTERECTOMY  1995    reports that she has never smoked. She has never used smokeless tobacco. She reports that she does not drink alcohol or use drugs. family history includes COPD in her father; Coronary artery disease in her mother; Heart disease in her maternal grandfather; Hyperlipidemia in her mother; Hypertension in her maternal grandmother and mother; Lupus in her mother; Stroke in her maternal grandmother, paternal grandfather, and paternal grandmother. Allergies  Allergen Reactions  . Indomethacin Other (See Comments)    The reaction is more of an intolerance. Patient states that she has nausea, dizziness and agitation.    Current Outpatient Medications on File Prior to Visit  Medication Sig Dispense Refill  . albuterol (PROVENTIL HFA;VENTOLIN HFA) 108 (90 Base) MCG/ACT inhaler Inhale 2 puffs into the lungs every 6 (six) hours as needed for wheezing or shortness of breath. 1 Inhaler 5  . allopurinol (ZYLOPRIM) 100 MG tablet Take 2 tablets by mouth daily.    Marland Kitchen aspirin 81 MG tablet Take 81  mg by mouth daily.      . budesonide-formoterol (SYMBICORT) 160-4.5 MCG/ACT inhaler Inhale 2 puffs into the lungs 2 (two) times daily. 3 Inhaler 3  . Colchicine 0.6 MG CAPS Take 1 capsule by mouth daily.    . hydrochlorothiazide (HYDRODIURIL) 25 MG tablet TAKE 1 TABLET(25 MG) BY MOUTH DAILY 90 tablet 0  . lisinopril (ZESTRIL) 20 MG tablet TAKE 1 TABLET(20 MG) BY MOUTH DAILY 90 tablet 0  . naproxen sodium (ANAPROX) 220 MG tablet Take 220 mg by mouth every morning.    Marland Kitchen VITAMIN D PO Take 5,000 Units by mouth daily.     No current facility-administered medications on file prior to visit.    Review of Systems  Constitutional: Negative for other unusual diaphoresis or sweats HENT: Negative for ear discharge or swelling Eyes: Negative for other worsening visual disturbances Respiratory: Negative for stridor or other swelling  Gastrointestinal: Negative for worsening distension or other blood Genitourinary: Negative for retention or other urinary change Musculoskeletal: Negative for other MSK pain or swelling Skin: Negative for color change or other new lesions Neurological: Negative for worsening tremors and other numbness  Psychiatric/Behavioral: Negative for worsening agitation or other fatigue All other system neg per pt    Objective:   Physical Exam BP 126/78   Pulse 81   Temp 97.9 F (36.6 C) (Oral)   Ht 5\' 8"  (1.727 m)   Wt 236 lb (107 kg)   SpO2  96%   BMI 35.88 kg/m  VS noted, non toxic Constitutional: Pt appears in NAD HENT: Head: NCAT.  Right Ear: External ear normal.  Left Ear: External ear normal.  Eyes: . Pupils are equal, round, and reactive to light. Conjunctivae and EOM are normal Nose: without d/c or deformity Neck: Neck supple. Gross normal ROM Cardiovascular: Normal rate and regular rhythm.   Pulmonary/Chest: Effort normal and breath sounds without rales or wheezing.  Abd:  Soft, NT, ND, + BS, no organomegaly Neurological: Pt is alert. At baseline orientation,  motor grossly intact Skin: right ankle area with red, tender, swelling 1+ with some extension more proximally by several cm without ulcer or abscess Psychiatric: Pt behavior is normal without agitation  No other exam findings Lab Results  Component Value Date   WBC 7.2 02/20/2019   HGB 12.0 02/20/2019   HCT 33.9 (L) 02/20/2019   PLT 136.0 (L) 02/20/2019   GLUCOSE 101 (H) 02/20/2019   CHOL 179 02/20/2019   TRIG 133.0 02/20/2019   HDL 57.30 02/20/2019   LDLDIRECT 84.0 02/17/2018   LDLCALC 95 02/20/2019   ALT 39 (H) 02/20/2019   AST 54 (H) 02/20/2019   NA 137 02/20/2019   K 3.9 02/20/2019   CL 103 02/20/2019   CREATININE 1.54 (H) 02/20/2019   BUN 28 (H) 02/20/2019   CO2 25 02/20/2019   TSH 2.14 02/20/2019   HGBA1C 5.3 02/20/2019       Assessment & Plan:

## 2019-05-25 ENCOUNTER — Telehealth: Payer: Self-pay | Admitting: Internal Medicine

## 2019-05-25 MED ORDER — HYDROCHLOROTHIAZIDE 25 MG PO TABS: ORAL_TABLET | ORAL | 1 refills | Status: DC

## 2019-05-25 MED ORDER — LISINOPRIL 20 MG PO TABS: ORAL_TABLET | ORAL | 1 refills | Status: DC

## 2019-05-25 NOTE — Telephone Encounter (Signed)
[id number] - [place], [name] [name] advised patient request was sent Monday and Wednesday hydrochlorothiazide (HYDRODIURIL) 25 MG tablet and lisinopril (ZESTRIL) 20 MG tablet , patient will run out on Saturday

## 2019-05-25 NOTE — Telephone Encounter (Signed)
No request was received but I will send in refills today.

## 2019-07-23 DIAGNOSIS — L280 Lichen simplex chronicus: Secondary | ICD-10-CM | POA: Diagnosis not present

## 2019-07-23 DIAGNOSIS — L299 Pruritus, unspecified: Secondary | ICD-10-CM | POA: Diagnosis not present

## 2019-07-23 DIAGNOSIS — Z79899 Other long term (current) drug therapy: Secondary | ICD-10-CM | POA: Diagnosis not present

## 2019-07-23 DIAGNOSIS — L281 Prurigo nodularis: Secondary | ICD-10-CM | POA: Diagnosis not present

## 2019-07-23 DIAGNOSIS — Z85828 Personal history of other malignant neoplasm of skin: Secondary | ICD-10-CM | POA: Diagnosis not present

## 2019-07-26 DIAGNOSIS — D696 Thrombocytopenia, unspecified: Secondary | ICD-10-CM | POA: Diagnosis not present

## 2019-09-27 IMAGING — US US RENAL
1 series · 14 of 25 positions shown · non-contrast
Comparison: None.

CLINICAL DATA: Chronic renal disease.

EXAM:
RENAL / URINARY TRACT ULTRASOUND COMPLETE

[Series 1: us renal · 0.21mm/px · 14 of 41 slices shown]
[im 1/41]
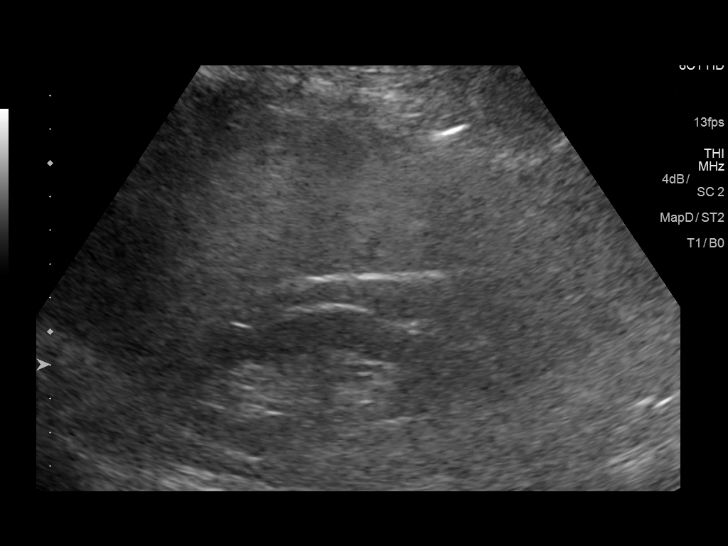
[im 4/41]
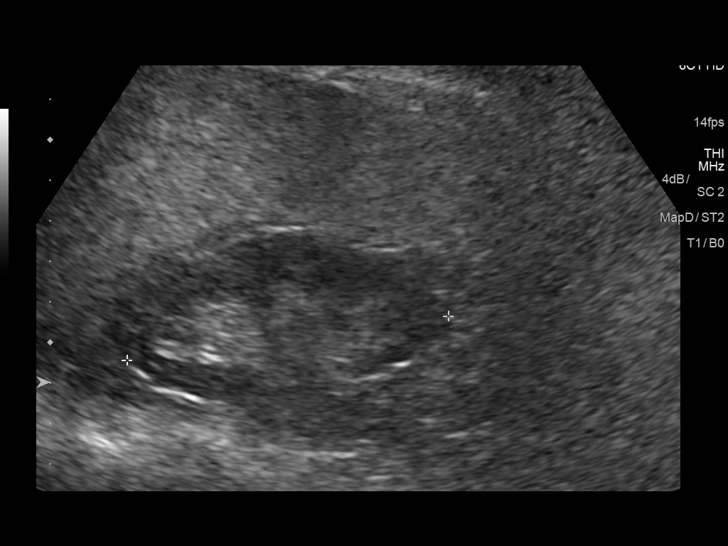
[im 7/41]
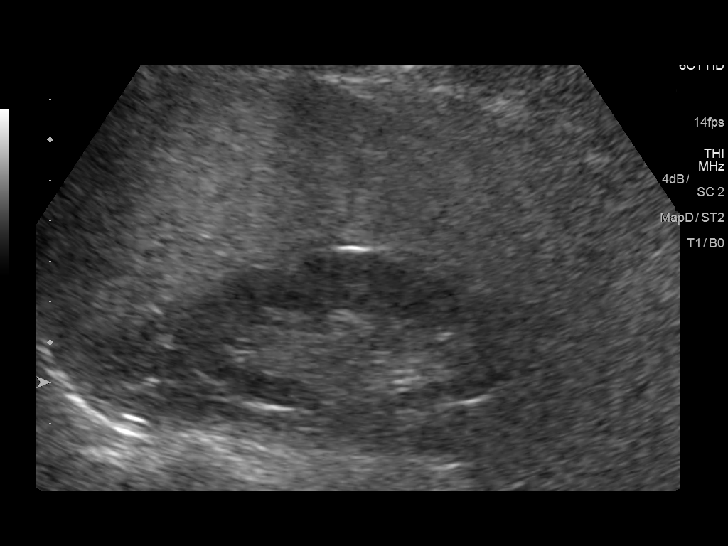
[im 11/41]
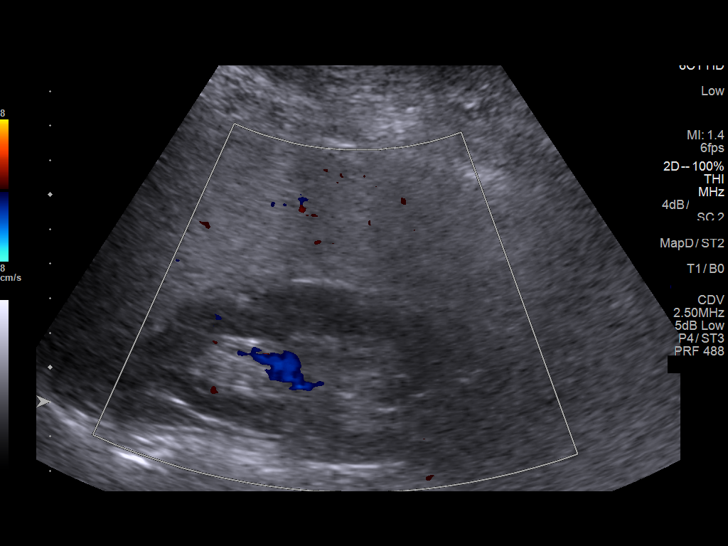
[im 14/41]
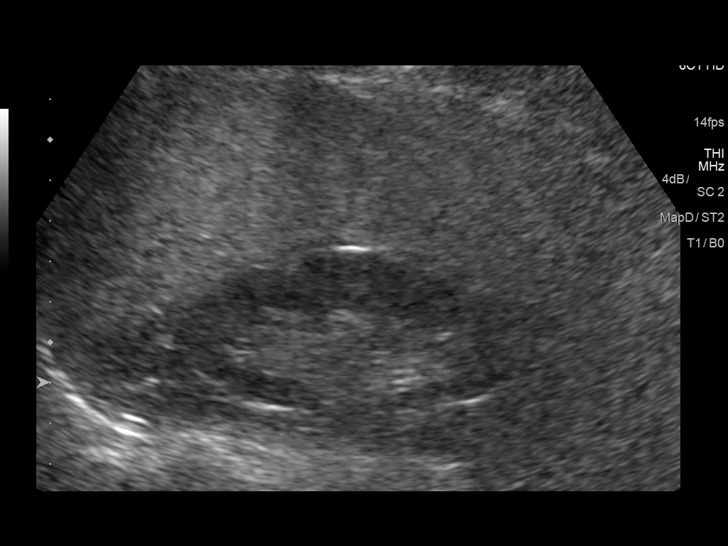
[im 16/41]
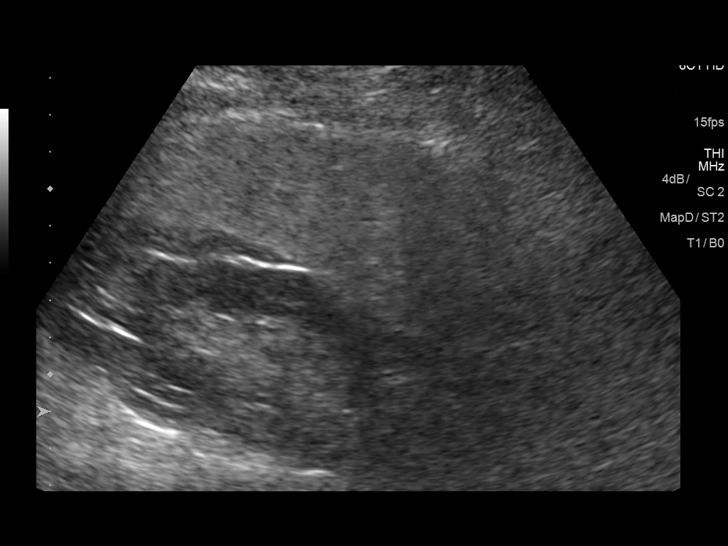
[im 19/41]
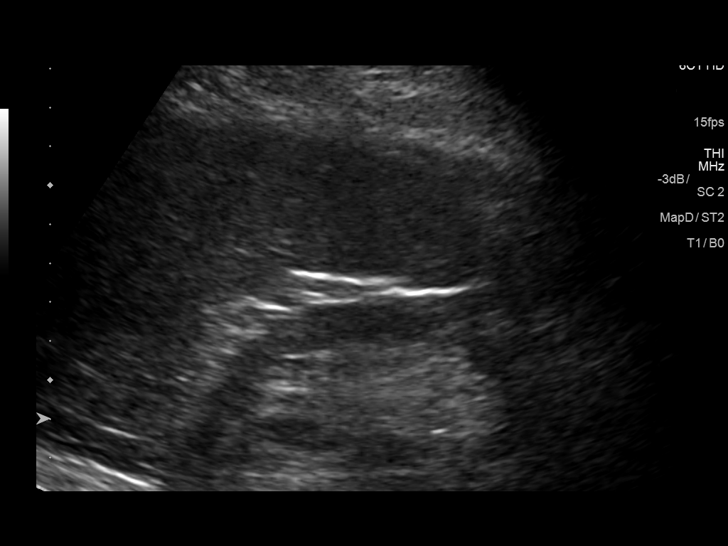
[im 22/41]
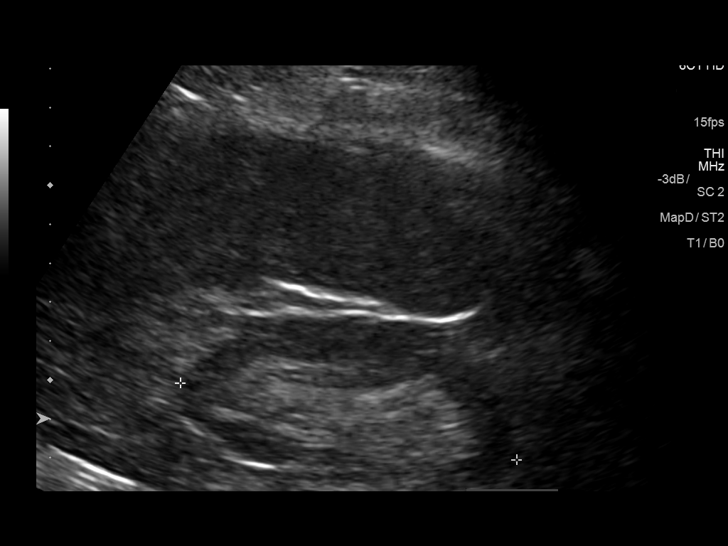
[im 26/41]
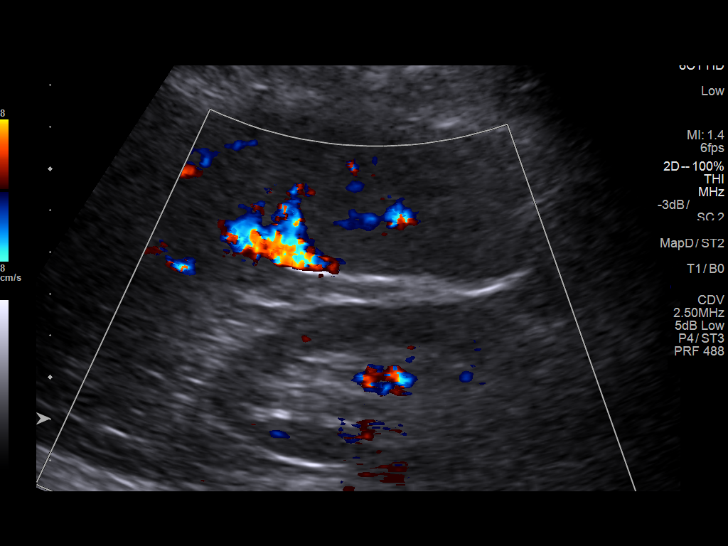
[im 27/41]
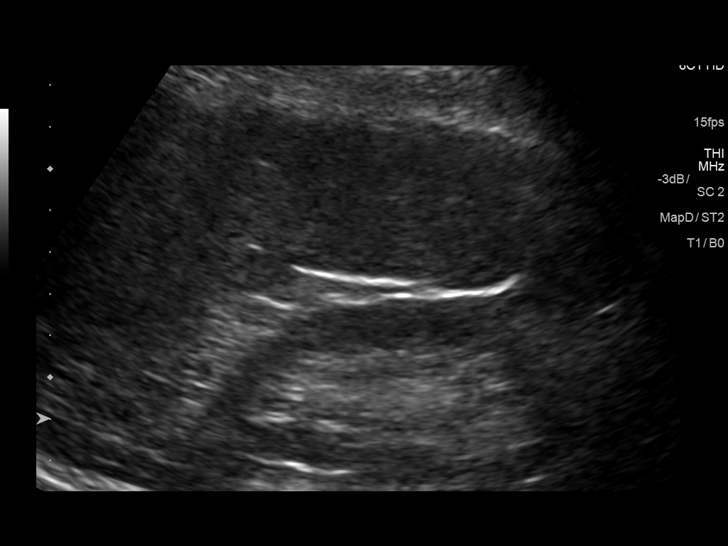
[im 31/41]
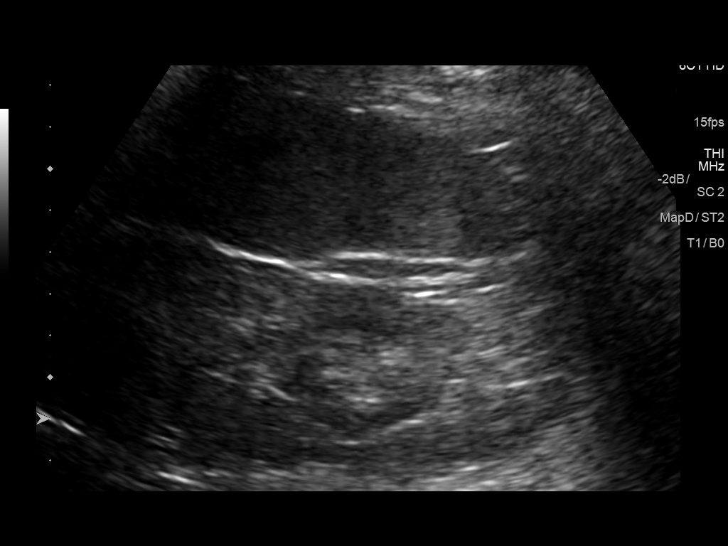
[im 34/41]
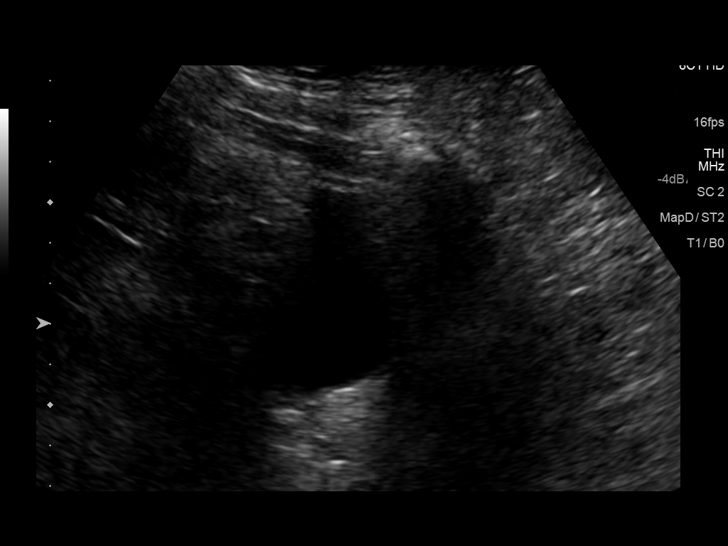
[im 37/41]
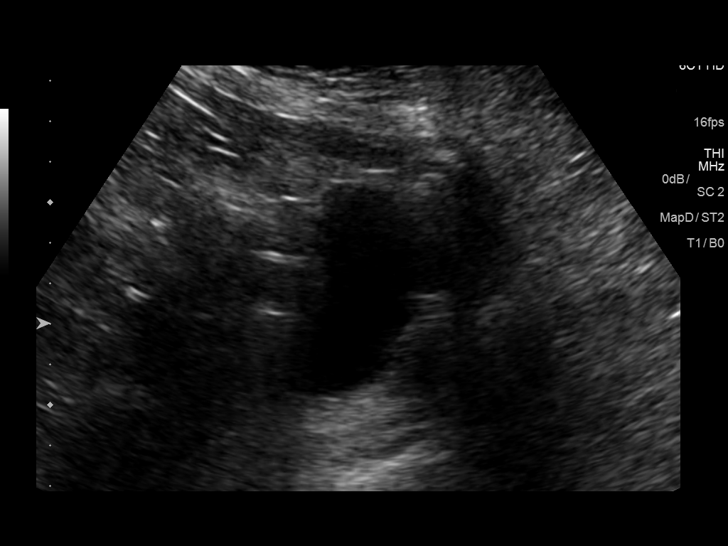
[im 41/41]
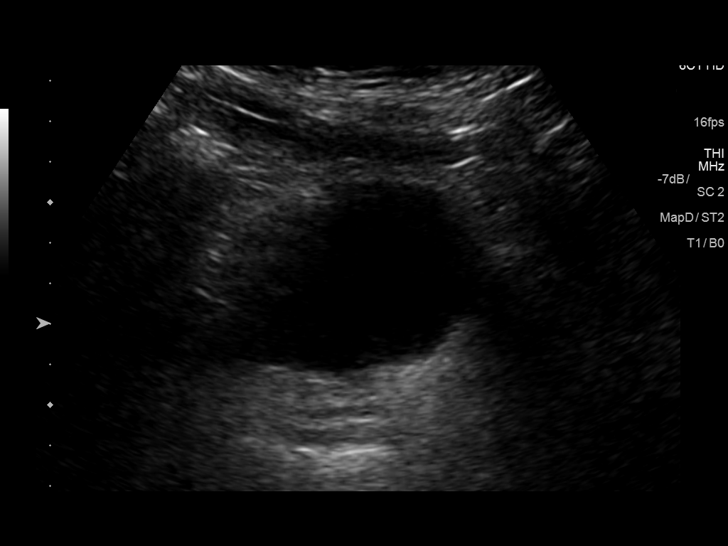

[14 of 25 positions shown; findings below may reference images not displayed]

FINDINGS: Right Kidney:

Renal measurements: 9.3 cm in length. Mild renal cortical thinning.
Echogenicity within normal limits. No mass or hydronephrosis
visualized.

Left Kidney:

Renal measurements: 9.0 cm in length. Mild renal cortical thinning.
Echogenicity within normal limits. No mass or hydronephrosis
visualized.

Bladder:

Appears normal for degree of bladder distention.
IMPRESSION: 1.  Mild bilateral renal cortical thinning.  Normal echogenicity.

2. No acute renal abnormalities identified. No hydronephrosis. No
bladder distention.

## 2019-10-03 DIAGNOSIS — L281 Prurigo nodularis: Secondary | ICD-10-CM | POA: Diagnosis not present

## 2019-10-03 DIAGNOSIS — L280 Lichen simplex chronicus: Secondary | ICD-10-CM | POA: Diagnosis not present

## 2019-10-03 DIAGNOSIS — Z9889 Other specified postprocedural states: Secondary | ICD-10-CM | POA: Diagnosis not present

## 2019-10-03 DIAGNOSIS — L299 Pruritus, unspecified: Secondary | ICD-10-CM | POA: Diagnosis not present

## 2019-10-03 DIAGNOSIS — T07XXXA Unspecified multiple injuries, initial encounter: Secondary | ICD-10-CM | POA: Diagnosis not present

## 2019-10-03 DIAGNOSIS — Z7952 Long term (current) use of systemic steroids: Secondary | ICD-10-CM | POA: Diagnosis not present

## 2019-10-03 DIAGNOSIS — Z85828 Personal history of other malignant neoplasm of skin: Secondary | ICD-10-CM | POA: Diagnosis not present

## 2019-11-07 DIAGNOSIS — H2513 Age-related nuclear cataract, bilateral: Secondary | ICD-10-CM | POA: Diagnosis not present

## 2019-11-15 ENCOUNTER — Other Ambulatory Visit: Payer: Self-pay | Admitting: Internal Medicine

## 2019-12-05 DIAGNOSIS — H2513 Age-related nuclear cataract, bilateral: Secondary | ICD-10-CM | POA: Diagnosis not present

## 2019-12-05 DIAGNOSIS — H35371 Puckering of macula, right eye: Secondary | ICD-10-CM | POA: Diagnosis not present

## 2019-12-05 DIAGNOSIS — H43811 Vitreous degeneration, right eye: Secondary | ICD-10-CM | POA: Diagnosis not present

## 2019-12-26 DIAGNOSIS — M150 Primary generalized (osteo)arthritis: Secondary | ICD-10-CM | POA: Diagnosis not present

## 2019-12-26 DIAGNOSIS — D696 Thrombocytopenia, unspecified: Secondary | ICD-10-CM | POA: Diagnosis not present

## 2019-12-26 DIAGNOSIS — K760 Fatty (change of) liver, not elsewhere classified: Secondary | ICD-10-CM | POA: Diagnosis not present

## 2019-12-26 DIAGNOSIS — Z79899 Other long term (current) drug therapy: Secondary | ICD-10-CM | POA: Diagnosis not present

## 2019-12-26 DIAGNOSIS — M1009 Idiopathic gout, multiple sites: Secondary | ICD-10-CM | POA: Diagnosis not present

## 2019-12-26 DIAGNOSIS — L409 Psoriasis, unspecified: Secondary | ICD-10-CM | POA: Diagnosis not present

## 2019-12-26 DIAGNOSIS — E669 Obesity, unspecified: Secondary | ICD-10-CM | POA: Diagnosis not present

## 2019-12-26 DIAGNOSIS — Z6835 Body mass index (BMI) 35.0-35.9, adult: Secondary | ICD-10-CM | POA: Diagnosis not present

## 2020-01-11 DIAGNOSIS — H25812 Combined forms of age-related cataract, left eye: Secondary | ICD-10-CM | POA: Diagnosis not present

## 2020-01-11 DIAGNOSIS — H2512 Age-related nuclear cataract, left eye: Secondary | ICD-10-CM | POA: Diagnosis not present

## 2020-01-16 IMAGING — US US ABDOMEN LIMITED
1 series · 14 of 25 positions shown · non-contrast
Comparison: None.

CLINICAL DATA: Elevated liver function tests.

EXAM:
ULTRASOUND ABDOMEN LIMITED RIGHT UPPER QUADRANT

[Series 1: us abdomen limited · 0.19mm/px · 14 of 58 slices shown]
[im 1/58]
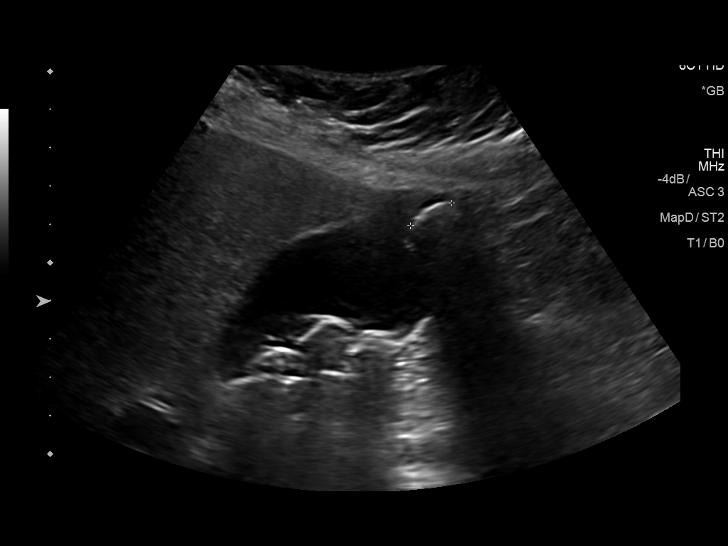
[im 5/58]
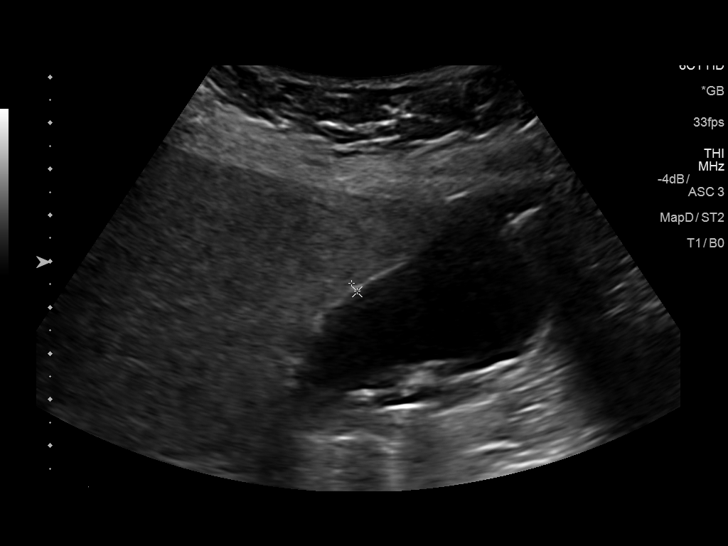
[im 10/58]
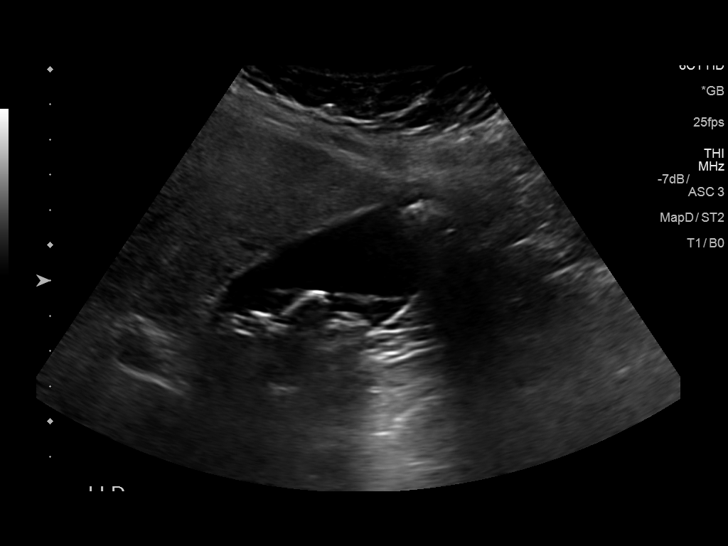
[im 15/58]
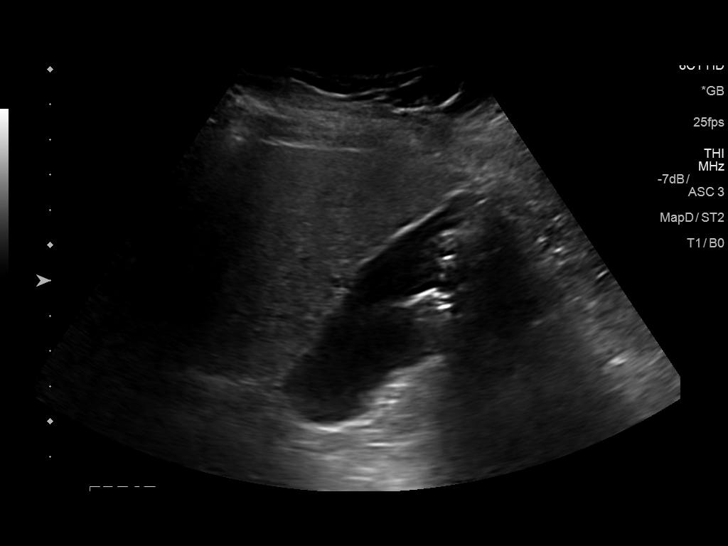
[im 20/58]
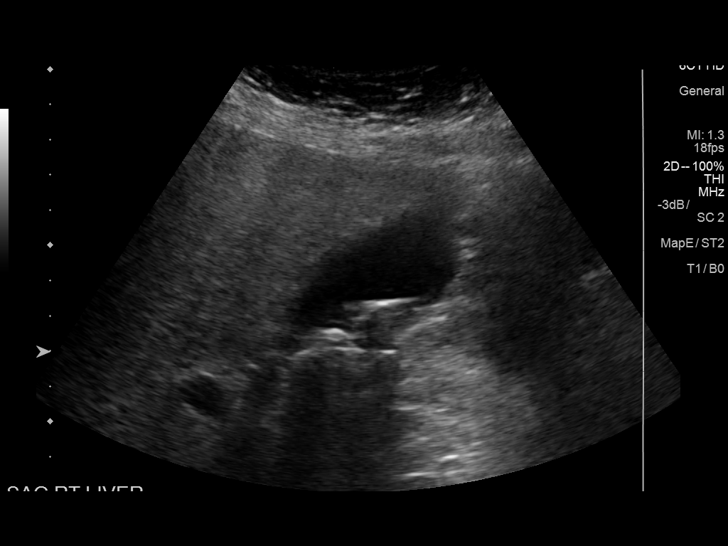
[im 22/58]
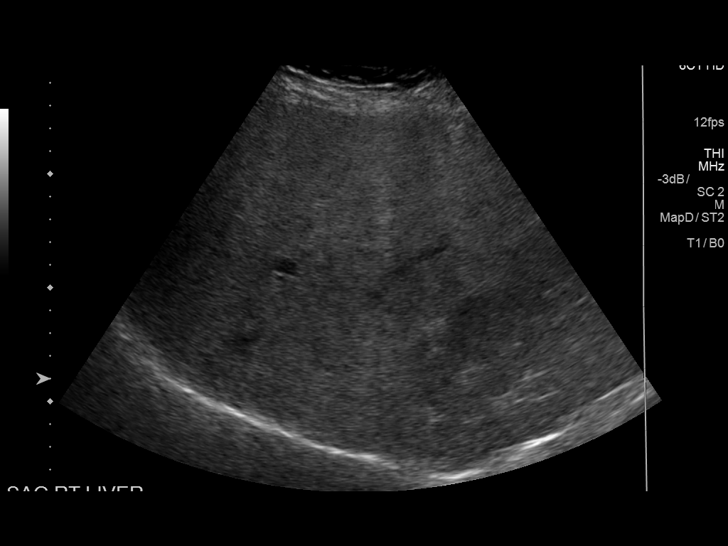
[im 27/58]
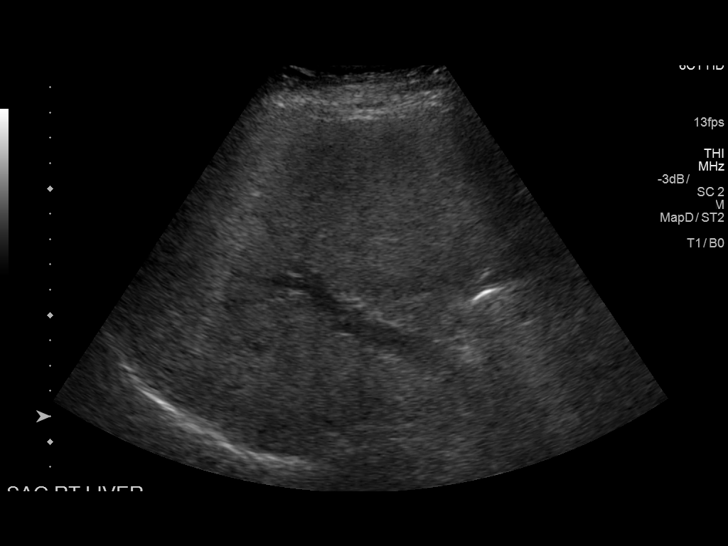
[im 31/58]
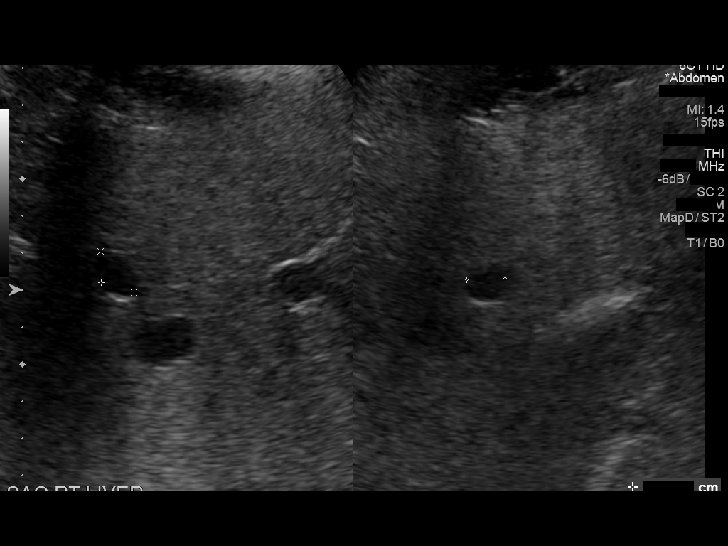
[im 36/58]
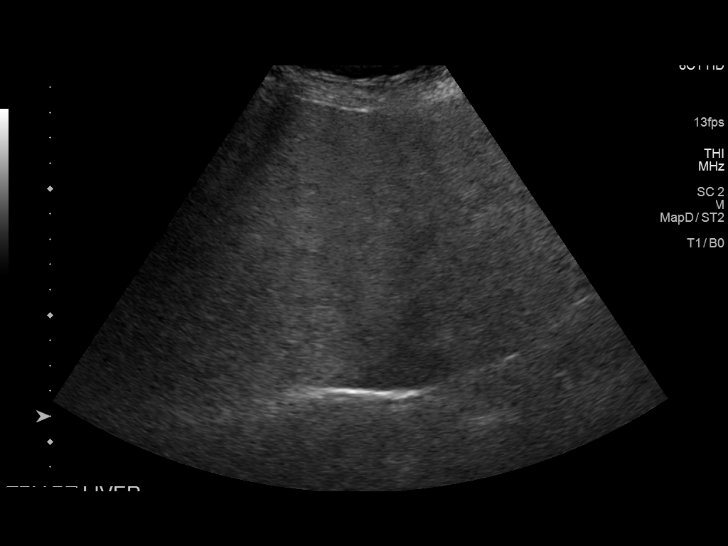
[im 39/58]
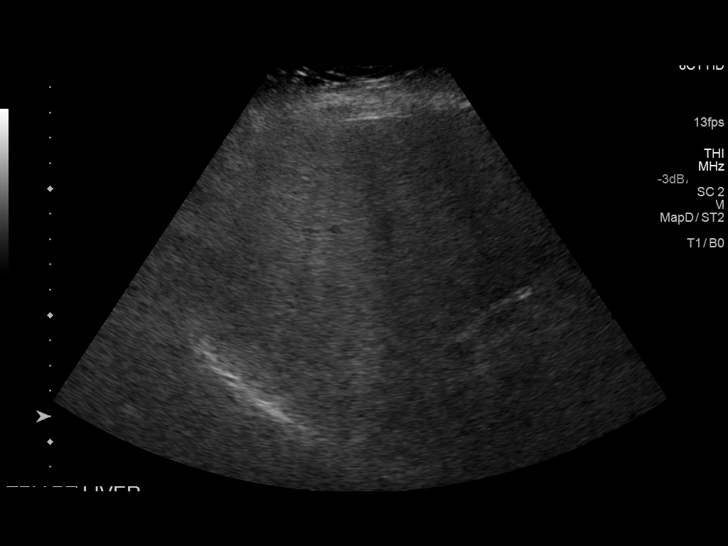
[im 43/58]
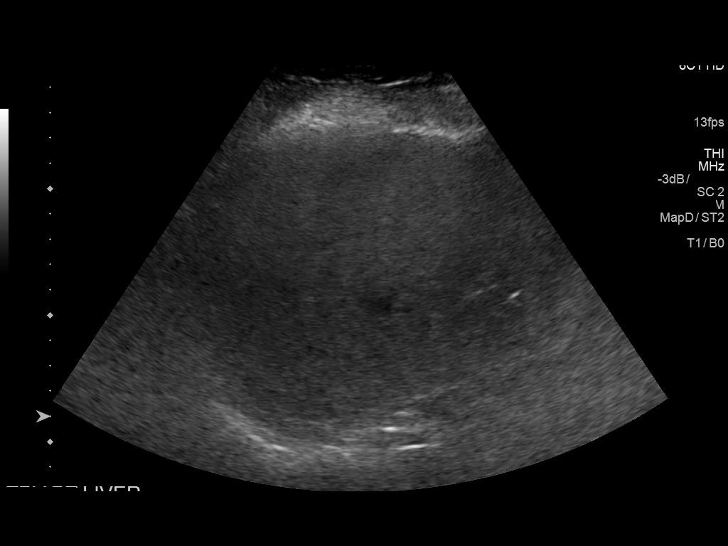
[im 48/58]
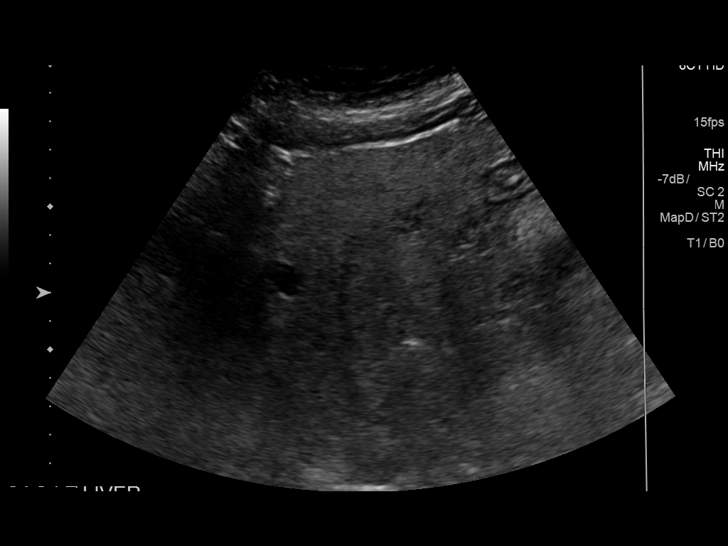
[im 53/58]
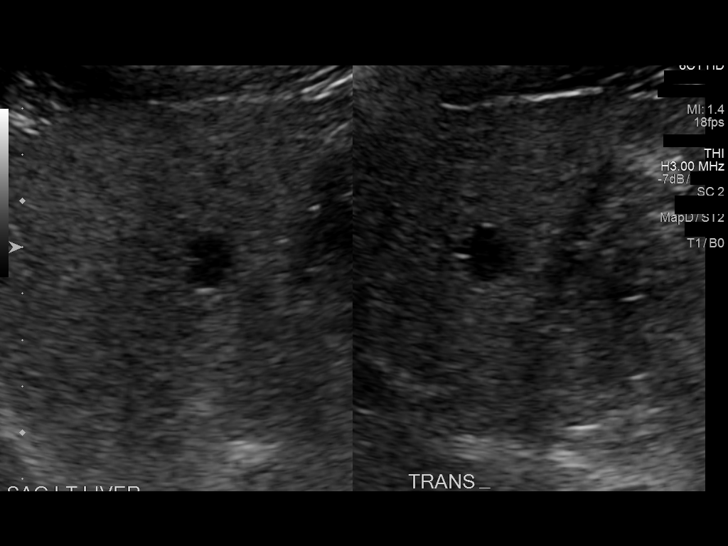
[im 58/58]
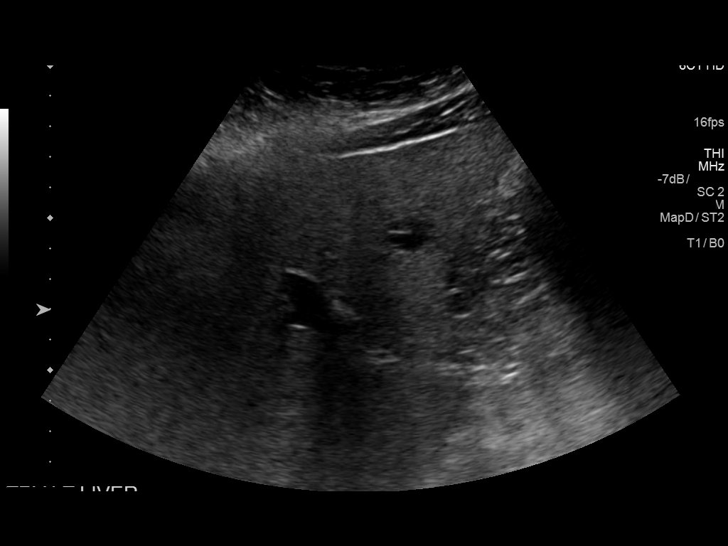

[14 of 25 positions shown; findings below may reference images not displayed]

FINDINGS: Gallbladder: Multiple mobile gallstones measuring up to 1.2 cm in
diameter are identified. There is no gallbladder wall thickening or
pericholecystic fluid. Sonographer reports negative Murphy's sign.

Common bile duct: Diameter: 0.3 cm.

Liver: Echotexture is heterogeneous and the liver appears dense most
consistent with fatty infiltration. 3 simple cysts are identified in
the liver measuring up to 1.7 cm in the right hepatic lobe. No solid
lesion is seen. Portal vein is patent on color Doppler imaging with
normal direction of blood flow towards the liver.
IMPRESSION: Fatty infiltration of the liver. Three simple hepatic cysts are
identified. No solid lesion.

Gallstones measuring up to 1.2 cm without evidence of cholecystitis.

## 2020-02-21 ENCOUNTER — Encounter: Payer: Self-pay | Admitting: Internal Medicine

## 2020-02-21 ENCOUNTER — Ambulatory Visit (INDEPENDENT_AMBULATORY_CARE_PROVIDER_SITE_OTHER): Payer: Medicare PPO | Admitting: Internal Medicine

## 2020-02-21 ENCOUNTER — Other Ambulatory Visit (INDEPENDENT_AMBULATORY_CARE_PROVIDER_SITE_OTHER): Payer: Medicare PPO

## 2020-02-21 ENCOUNTER — Other Ambulatory Visit: Payer: Self-pay

## 2020-02-21 VITALS — BP 130/60 | HR 85 | Temp 98.5°F | Ht 68.0 in | Wt 235.0 lb

## 2020-02-21 DIAGNOSIS — N183 Chronic kidney disease, stage 3 unspecified: Secondary | ICD-10-CM

## 2020-02-21 DIAGNOSIS — Z0000 Encounter for general adult medical examination without abnormal findings: Secondary | ICD-10-CM | POA: Diagnosis not present

## 2020-02-21 DIAGNOSIS — I10 Essential (primary) hypertension: Secondary | ICD-10-CM

## 2020-02-21 DIAGNOSIS — R739 Hyperglycemia, unspecified: Secondary | ICD-10-CM | POA: Diagnosis not present

## 2020-02-21 DIAGNOSIS — E785 Hyperlipidemia, unspecified: Secondary | ICD-10-CM | POA: Insufficient documentation

## 2020-02-21 LAB — CBC WITH DIFFERENTIAL/PLATELET
Basophils Absolute: 0.1 10*3/uL (ref 0.0–0.1)
Basophils Relative: 1.5 % (ref 0.0–3.0)
Eosinophils Absolute: 0.4 10*3/uL (ref 0.0–0.7)
Eosinophils Relative: 4.6 % (ref 0.0–5.0)
HCT: 35.1 % — ABNORMAL LOW (ref 36.0–46.0)
Hemoglobin: 12.4 g/dL (ref 12.0–15.0)
Lymphocytes Relative: 28.7 % (ref 12.0–46.0)
Lymphs Abs: 2.5 10*3/uL (ref 0.7–4.0)
MCHC: 35.3 g/dL (ref 30.0–36.0)
MCV: 101.3 fl — ABNORMAL HIGH (ref 78.0–100.0)
Monocytes Absolute: 0.6 10*3/uL (ref 0.1–1.0)
Monocytes Relative: 6.4 % (ref 3.0–12.0)
Neutro Abs: 5.2 10*3/uL (ref 1.4–7.7)
Neutrophils Relative %: 58.8 % (ref 43.0–77.0)
Platelets: 164 10*3/uL (ref 150.0–400.0)
RBC: 3.46 Mil/uL — ABNORMAL LOW (ref 3.87–5.11)
RDW: 14.8 % (ref 11.5–15.5)
WBC: 8.8 10*3/uL (ref 4.0–10.5)

## 2020-02-21 LAB — COMPREHENSIVE METABOLIC PANEL
ALT: 19 U/L (ref 0–35)
AST: 24 U/L (ref 0–37)
Albumin: 4.6 g/dL (ref 3.5–5.2)
Alkaline Phosphatase: 52 U/L (ref 39–117)
BUN: 33 mg/dL — ABNORMAL HIGH (ref 6–23)
CO2: 28 mEq/L (ref 19–32)
Calcium: 10.2 mg/dL (ref 8.4–10.5)
Chloride: 101 mEq/L (ref 96–112)
Creatinine, Ser: 1.62 mg/dL — ABNORMAL HIGH (ref 0.40–1.20)
GFR: 31.34 mL/min — ABNORMAL LOW (ref >60.00)
Glucose, Bld: 99 mg/dL (ref 70–99)
Potassium: 4.6 mEq/L (ref 3.5–5.1)
Sodium: 135 mEq/L (ref 135–145)
Total Bilirubin: 1.2 mg/dL (ref 0.2–1.2)
Total Protein: 7.3 g/dL (ref 6.0–8.3)

## 2020-02-21 LAB — LIPID PANEL
Cholesterol: 208 mg/dL — ABNORMAL HIGH (ref 0–200)
HDL: 80.4 mg/dL (ref >39.00)
LDL Cholesterol: 103 mg/dL — ABNORMAL HIGH (ref 0–99)
NonHDL: 128.08
Total CHOL/HDL Ratio: 3
Triglycerides: 124 mg/dL (ref 0.0–149.0)
VLDL: 24.8 mg/dL (ref 0.0–40.0)

## 2020-02-21 LAB — TSH: TSH: 2.2 u[IU]/mL (ref 0.35–4.50)

## 2020-02-21 LAB — HEMOGLOBIN A1C: Hgb A1c MFr Bld: 4.7 % (ref 4.6–6.5)

## 2020-02-21 MED ORDER — ATORVASTATIN CALCIUM 10 MG PO TABS: 10.0000 mg | ORAL_TABLET | Freq: Every day | ORAL | 3 refills | Status: DC

## 2020-02-21 NOTE — Assessment & Plan Note (Signed)
stable overall by history and exam, recent data reviewed with pt, and pt to continue medical treatment as before,  to f/u any worsening symptoms or concerns  

## 2020-02-21 NOTE — Assessment & Plan Note (Signed)
With ldl goal < 70 - for lipitor 10 qd

## 2020-02-21 NOTE — Progress Notes (Signed)
Subjective:    Patient ID: [name] [name], female    DOB: 03/24/1951, 69 y.o.   MRN: [id number]  HPI  Here for wellness and f/u;  Overall doing ok;  Pt denies Chest pain, worsening SOB, DOE, wheezing, orthopnea, PND, worsening LE edema, palpitations, dizziness or syncope.  Pt denies neurological change such as new headache, facial or extremity weakness.  Pt denies polydipsia, polyuria, or low sugar symptoms. Pt states overall good compliance with treatment and medications, good tolerability, and has been trying to follow appropriate diet.  Pt denies worsening depressive symptoms, suicidal ideation or panic. No fever, night sweats, wt loss, loss of appetite, or other constitutional symptoms.  Pt states good ability with ADL's, has low fall risk, home safety reviewed and adequate, no other significant changes in hearing or vision, and only occasionally active with exercise. No new complaints  Has f/u soon with renal dr Allena Katz Past Medical History:  Diagnosis Date   Arthritis    knee   Eczema 10/16/2014   Hypertension    Obesity    Past Surgical History:  Procedure Laterality Date   ANAL FISSURE REPAIR  1998   ARTHROSCOPIC REPAIR ACL Right 2010   DIAGNOSTIC MAMMOGRAM  2013   MYOMECTOMY  1985   TOTAL ABDOMINAL HYSTERECTOMY  1995    reports that she has never smoked. She has never used smokeless tobacco. She reports that she does not drink alcohol and does not use drugs. family history includes COPD in her father; Coronary artery disease in her mother; Heart disease in her maternal grandfather; Hyperlipidemia in her mother; Hypertension in her maternal grandmother and mother; Lupus in her mother; Stroke in her maternal grandmother, paternal grandfather, and paternal grandmother. Allergies  Allergen Reactions   Indomethacin Other (See Comments)    The reaction is more of an intolerance. Patient states that she has nausea, dizziness and agitation.    Current Outpatient Medications  on File Prior to Visit  Medication Sig Dispense Refill   albuterol (PROVENTIL HFA;VENTOLIN HFA) 108 (90 Base) MCG/ACT inhaler Inhale 2 puffs into the lungs every 6 (six) hours as needed for wheezing or shortness of breath. 1 Inhaler 5   allopurinol (ZYLOPRIM) 100 MG tablet Take 2 tablets by mouth daily.     aspirin 81 MG tablet Take 81 mg by mouth daily.       budesonide-formoterol (SYMBICORT) 160-4.5 MCG/ACT inhaler Inhale 2 puffs into the lungs 2 (two) times daily. 3 Inhaler 3   Cholecalciferol 125 MCG (5000 UT) TABS Take by mouth.     Colchicine 0.6 MG CAPS Take 1 capsule by mouth daily.     hydrochlorothiazide (HYDRODIURIL) 25 MG tablet TAKE 1 TABLET(25 MG) BY MOUTH DAILY Annual appt due in August must see provider for future refills 90 tablet 1   Influenza vac split quadrivalent PF (FLUZONE HIGH-DOSE) 0.5 ML injection Fluzone High-Dose 2019-20 (PF) 180 mcg/0.5 mL intramuscular syringe  ADM 0.5ML IM UTD     lisinopril (ZESTRIL) 20 MG tablet TAKE 1 TABLET(20 MG) BY MOUTH DAILY Annual appt due in August must see provider for future refills 90 tablet 1   No current facility-administered medications on file prior to visit.   Review of Systems All otherwise neg per pt    Objective:   Physical Exam BP 130/60 (BP Location: Left Arm, Patient Position: Sitting, Cuff Size: Large)    Pulse 85    Temp 98.5 F (36.9 C) (Oral)    Ht 5\' 8"  (1.727 m)  Wt 235 lb (106.6 kg)    SpO2 97%    BMI 35.73 kg/m  VS noted,  Constitutional: Pt appears in NAD HENT: Head: NCAT.  Right Ear: External ear normal.  Left Ear: External ear normal.  Eyes: . Pupils are equal, round, and reactive to light. Conjunctivae and EOM are normal Nose: without d/c or deformity Neck: Neck supple. Gross normal ROM Cardiovascular: Normal rate and regular rhythm.   Pulmonary/Chest: Effort normal and breath sounds without rales or wheezing.  Abd:  Soft, NT, ND, + BS, no organomegaly Neurological: Pt is alert. At  baseline orientation, motor grossly intact Skin: Skin is warm. No rashes, other new lesions, no LE edema Psychiatric: Pt behavior is normal without agitation  All otherwise neg per pt Lab Results  Component Value Date   WBC 8.8 02/21/2020   HGB 12.4 02/21/2020   HCT 35.1 (L) 02/21/2020   PLT 164.0 02/21/2020   GLUCOSE 99 02/21/2020   CHOL 208 (H) 02/21/2020   TRIG 124.0 02/21/2020   HDL 80.40 02/21/2020   LDLDIRECT 84.0 02/17/2018   LDLCALC 103 (H) 02/21/2020   ALT 19 02/21/2020   AST 24 02/21/2020   NA 135 02/21/2020   K 4.6 02/21/2020   CL 101 02/21/2020   CREATININE 1.62 (H) 02/21/2020   BUN 33 (H) 02/21/2020   CO2 28 02/21/2020   TSH 2.20 02/21/2020   HGBA1C 4.7 02/21/2020      Assessment & Plan:

## 2020-02-21 NOTE — Assessment & Plan Note (Signed)

## 2020-02-21 NOTE — Patient Instructions (Signed)
Please take all new medication as prescribed - the lipitor 10 mg per day  Please continue all other medications as before, and refills have been done if requested.  Please have the pharmacy call with any other refills you may need.  Please continue your efforts at being more active, low cholesterol diet, and weight control.  You are otherwise up to date with prevention measures today.  Please keep your appointments with your specialists as you may have planned - Dr Allena Katz  Please go to the LAB at the blood drawing area for the tests to be done  You will be contacted by phone if any changes need to be made immediately.  Otherwise, you will receive a letter about your results with an explanation, but please check with MyChart first.  Please remember to sign up for MyChart if you have not done so, as this will be important to you in the future with finding out test results, communicating by private email, and scheduling acute appointments online when needed.  Please make an Appointment to return for your 1 year visit, or sooner if needed

## 2020-02-21 NOTE — Assessment & Plan Note (Signed)
For f/u soon renal Dr Allena Katz

## 2020-02-22 ENCOUNTER — Encounter: Payer: Self-pay | Admitting: Internal Medicine

## 2020-02-26 DIAGNOSIS — H2511 Age-related nuclear cataract, right eye: Secondary | ICD-10-CM | POA: Diagnosis not present

## 2020-02-27 DIAGNOSIS — N183 Chronic kidney disease, stage 3 unspecified: Secondary | ICD-10-CM | POA: Diagnosis not present

## 2020-02-29 DIAGNOSIS — H25811 Combined forms of age-related cataract, right eye: Secondary | ICD-10-CM | POA: Diagnosis not present

## 2020-02-29 DIAGNOSIS — H2511 Age-related nuclear cataract, right eye: Secondary | ICD-10-CM | POA: Diagnosis not present

## 2020-03-17 DIAGNOSIS — N2581 Secondary hyperparathyroidism of renal origin: Secondary | ICD-10-CM | POA: Diagnosis not present

## 2020-03-17 DIAGNOSIS — N183 Chronic kidney disease, stage 3 unspecified: Secondary | ICD-10-CM | POA: Diagnosis not present

## 2020-03-17 DIAGNOSIS — I129 Hypertensive chronic kidney disease with stage 1 through stage 4 chronic kidney disease, or unspecified chronic kidney disease: Secondary | ICD-10-CM | POA: Diagnosis not present

## 2020-03-17 DIAGNOSIS — D631 Anemia in chronic kidney disease: Secondary | ICD-10-CM | POA: Diagnosis not present

## 2020-04-01 DIAGNOSIS — H35361 Drusen (degenerative) of macula, right eye: Secondary | ICD-10-CM | POA: Diagnosis not present

## 2020-04-01 DIAGNOSIS — H35373 Puckering of macula, bilateral: Secondary | ICD-10-CM | POA: Diagnosis not present

## 2020-04-01 DIAGNOSIS — H43822 Vitreomacular adhesion, left eye: Secondary | ICD-10-CM | POA: Diagnosis not present

## 2020-04-01 DIAGNOSIS — H33311 Horseshoe tear of retina without detachment, right eye: Secondary | ICD-10-CM | POA: Diagnosis not present

## 2020-04-04 DIAGNOSIS — H33311 Horseshoe tear of retina without detachment, right eye: Secondary | ICD-10-CM | POA: Diagnosis not present

## 2020-04-21 DIAGNOSIS — H33311 Horseshoe tear of retina without detachment, right eye: Secondary | ICD-10-CM | POA: Diagnosis not present

## 2020-05-16 ENCOUNTER — Other Ambulatory Visit: Payer: Self-pay | Admitting: Internal Medicine

## 2020-05-26 DIAGNOSIS — Z1231 Encounter for screening mammogram for malignant neoplasm of breast: Secondary | ICD-10-CM | POA: Diagnosis not present

## 2020-06-26 DIAGNOSIS — Z6836 Body mass index (BMI) 36.0-36.9, adult: Secondary | ICD-10-CM | POA: Diagnosis not present

## 2020-06-26 DIAGNOSIS — M150 Primary generalized (osteo)arthritis: Secondary | ICD-10-CM | POA: Diagnosis not present

## 2020-06-26 DIAGNOSIS — L409 Psoriasis, unspecified: Secondary | ICD-10-CM | POA: Diagnosis not present

## 2020-06-26 DIAGNOSIS — M1009 Idiopathic gout, multiple sites: Secondary | ICD-10-CM | POA: Diagnosis not present

## 2020-06-26 DIAGNOSIS — E669 Obesity, unspecified: Secondary | ICD-10-CM | POA: Diagnosis not present

## 2020-06-26 DIAGNOSIS — K760 Fatty (change of) liver, not elsewhere classified: Secondary | ICD-10-CM | POA: Diagnosis not present

## 2020-06-26 DIAGNOSIS — Z79899 Other long term (current) drug therapy: Secondary | ICD-10-CM | POA: Diagnosis not present

## 2020-07-21 DIAGNOSIS — H43822 Vitreomacular adhesion, left eye: Secondary | ICD-10-CM | POA: Diagnosis not present

## 2020-07-21 DIAGNOSIS — H35373 Puckering of macula, bilateral: Secondary | ICD-10-CM | POA: Diagnosis not present

## 2020-11-06 DIAGNOSIS — N183 Chronic kidney disease, stage 3 unspecified: Secondary | ICD-10-CM | POA: Diagnosis not present

## 2020-11-12 ENCOUNTER — Other Ambulatory Visit: Payer: Self-pay | Admitting: Internal Medicine

## 2020-11-12 NOTE — Telephone Encounter (Signed)
Please refill as per office routine med refill policy (all routine meds refilled for 3 mo or monthly per pt preference up to one year from last visit, then month to month grace period for 3 mo, then further med refills will have to be denied)  

## 2020-11-13 ENCOUNTER — Other Ambulatory Visit: Payer: Self-pay | Admitting: Internal Medicine

## 2020-11-13 MED ORDER — HYDROCHLOROTHIAZIDE 25 MG PO TABS: 1.0000 | ORAL_TABLET | Freq: Every day | ORAL | 0 refills | Status: DC

## 2020-11-13 MED ORDER — LISINOPRIL 20 MG PO TABS: 1.0000 | ORAL_TABLET | Freq: Every day | ORAL | 0 refills | Status: DC

## 2020-11-13 NOTE — Telephone Encounter (Signed)
Please refill as per office routine med refill policy (all routine meds refilled for 3 mo or monthly per pt preference up to one year from last visit, then month to month grace period for 3 mo, then further med refills will have to be denied)  

## 2020-11-14 DIAGNOSIS — N1832 Chronic kidney disease, stage 3b: Secondary | ICD-10-CM | POA: Diagnosis not present

## 2020-11-14 DIAGNOSIS — N2581 Secondary hyperparathyroidism of renal origin: Secondary | ICD-10-CM | POA: Diagnosis not present

## 2020-11-14 DIAGNOSIS — D631 Anemia in chronic kidney disease: Secondary | ICD-10-CM | POA: Diagnosis not present

## 2020-11-14 DIAGNOSIS — I129 Hypertensive chronic kidney disease with stage 1 through stage 4 chronic kidney disease, or unspecified chronic kidney disease: Secondary | ICD-10-CM | POA: Diagnosis not present

## 2020-12-25 DIAGNOSIS — Z6835 Body mass index (BMI) 35.0-35.9, adult: Secondary | ICD-10-CM | POA: Diagnosis not present

## 2020-12-25 DIAGNOSIS — K760 Fatty (change of) liver, not elsewhere classified: Secondary | ICD-10-CM | POA: Diagnosis not present

## 2020-12-25 DIAGNOSIS — D509 Iron deficiency anemia, unspecified: Secondary | ICD-10-CM | POA: Diagnosis not present

## 2020-12-25 DIAGNOSIS — M064 Inflammatory polyarthropathy: Secondary | ICD-10-CM | POA: Diagnosis not present

## 2020-12-25 DIAGNOSIS — M150 Primary generalized (osteo)arthritis: Secondary | ICD-10-CM | POA: Diagnosis not present

## 2020-12-25 DIAGNOSIS — Z79899 Other long term (current) drug therapy: Secondary | ICD-10-CM | POA: Diagnosis not present

## 2020-12-25 DIAGNOSIS — L409 Psoriasis, unspecified: Secondary | ICD-10-CM | POA: Diagnosis not present

## 2020-12-25 DIAGNOSIS — M1009 Idiopathic gout, multiple sites: Secondary | ICD-10-CM | POA: Diagnosis not present

## 2020-12-25 DIAGNOSIS — E669 Obesity, unspecified: Secondary | ICD-10-CM | POA: Diagnosis not present

## 2021-01-12 ENCOUNTER — Other Ambulatory Visit: Payer: Self-pay | Admitting: Internal Medicine

## 2021-01-13 NOTE — Telephone Encounter (Signed)
.  orut

## 2021-01-13 NOTE — Telephone Encounter (Signed)
Please refill as per office routine med refill policy (all routine meds refilled for 3 mo or monthly per pt preference up to one year from last visit, then month to month grace period for 3 mo, then further med refills will have to be denied)  

## 2021-01-15 DIAGNOSIS — L281 Prurigo nodularis: Secondary | ICD-10-CM | POA: Diagnosis not present

## 2021-01-15 DIAGNOSIS — L299 Pruritus, unspecified: Secondary | ICD-10-CM | POA: Diagnosis not present

## 2021-01-15 DIAGNOSIS — Z85828 Personal history of other malignant neoplasm of skin: Secondary | ICD-10-CM | POA: Diagnosis not present

## 2021-01-15 DIAGNOSIS — L280 Lichen simplex chronicus: Secondary | ICD-10-CM | POA: Diagnosis not present

## 2021-02-10 ENCOUNTER — Other Ambulatory Visit: Payer: Self-pay | Admitting: Internal Medicine

## 2021-02-12 ENCOUNTER — Other Ambulatory Visit: Payer: Self-pay | Admitting: Internal Medicine

## 2021-03-09 ENCOUNTER — Telehealth: Payer: Self-pay | Admitting: Internal Medicine

## 2021-03-09 NOTE — Telephone Encounter (Signed)
Left message for patient to call me back at [phone] to schedule Medicare Annual Wellness Visit   No hx of AWV eligible as of 04/18/18  Please schedule at anytime with [facility] if patient calls the office back.    45 Minutes appointment   Any questions, please call me at [phone]  

## 2021-03-13 DIAGNOSIS — H43811 Vitreous degeneration, right eye: Secondary | ICD-10-CM | POA: Diagnosis not present

## 2021-03-26 DIAGNOSIS — H43811 Vitreous degeneration, right eye: Secondary | ICD-10-CM | POA: Diagnosis not present

## 2021-03-26 DIAGNOSIS — H35371 Puckering of macula, right eye: Secondary | ICD-10-CM | POA: Diagnosis not present

## 2021-03-26 DIAGNOSIS — Z961 Presence of intraocular lens: Secondary | ICD-10-CM | POA: Diagnosis not present

## 2021-03-26 DIAGNOSIS — H26491 Other secondary cataract, right eye: Secondary | ICD-10-CM | POA: Diagnosis not present

## 2021-03-31 ENCOUNTER — Encounter: Payer: Self-pay | Admitting: Internal Medicine

## 2021-03-31 ENCOUNTER — Ambulatory Visit (INDEPENDENT_AMBULATORY_CARE_PROVIDER_SITE_OTHER): Payer: Medicare PPO | Admitting: Internal Medicine

## 2021-03-31 ENCOUNTER — Telehealth: Payer: Self-pay | Admitting: Internal Medicine

## 2021-03-31 ENCOUNTER — Other Ambulatory Visit: Payer: Self-pay

## 2021-03-31 VITALS — BP 116/56 | HR 74 | Temp 98.7°F | Ht 68.0 in | Wt 233.0 lb

## 2021-03-31 DIAGNOSIS — E559 Vitamin D deficiency, unspecified: Secondary | ICD-10-CM

## 2021-03-31 DIAGNOSIS — J4530 Mild persistent asthma, uncomplicated: Secondary | ICD-10-CM

## 2021-03-31 DIAGNOSIS — N1831 Chronic kidney disease, stage 3a: Secondary | ICD-10-CM

## 2021-03-31 DIAGNOSIS — I10 Essential (primary) hypertension: Secondary | ICD-10-CM

## 2021-03-31 DIAGNOSIS — Z23 Encounter for immunization: Secondary | ICD-10-CM | POA: Diagnosis not present

## 2021-03-31 DIAGNOSIS — E7800 Pure hypercholesterolemia, unspecified: Secondary | ICD-10-CM

## 2021-03-31 DIAGNOSIS — E538 Deficiency of other specified B group vitamins: Secondary | ICD-10-CM

## 2021-03-31 DIAGNOSIS — Z0001 Encounter for general adult medical examination with abnormal findings: Secondary | ICD-10-CM

## 2021-03-31 DIAGNOSIS — F411 Generalized anxiety disorder: Secondary | ICD-10-CM | POA: Diagnosis not present

## 2021-03-31 DIAGNOSIS — R739 Hyperglycemia, unspecified: Secondary | ICD-10-CM

## 2021-03-31 LAB — HEPATIC FUNCTION PANEL
ALT: 17 U/L (ref 0–35)
AST: 20 U/L (ref 0–37)
Albumin: 4.4 g/dL (ref 3.5–5.2)
Alkaline Phosphatase: 59 U/L (ref 39–117)
Bilirubin, Direct: 0.3 mg/dL (ref 0.0–0.3)
Total Bilirubin: 1.3 mg/dL — ABNORMAL HIGH (ref 0.2–1.2)
Total Protein: 7.3 g/dL (ref 6.0–8.3)

## 2021-03-31 LAB — CBC WITH DIFFERENTIAL/PLATELET
Basophils Absolute: 0.1 10*3/uL (ref 0.0–0.1)
Basophils Relative: 1.1 % (ref 0.0–3.0)
Eosinophils Absolute: 0.3 10*3/uL (ref 0.0–0.7)
Eosinophils Relative: 3.2 % (ref 0.0–5.0)
HCT: 33.2 % — ABNORMAL LOW (ref 36.0–46.0)
Hemoglobin: 11.7 g/dL — ABNORMAL LOW (ref 12.0–15.0)
Lymphocytes Relative: 19.5 % (ref 12.0–46.0)
Lymphs Abs: 2 10*3/uL (ref 0.7–4.0)
MCHC: 35.2 g/dL (ref 30.0–36.0)
MCV: 100.1 fl — ABNORMAL HIGH (ref 78.0–100.0)
Monocytes Absolute: 0.6 10*3/uL (ref 0.1–1.0)
Monocytes Relative: 6.1 % (ref 3.0–12.0)
Neutro Abs: 7.2 10*3/uL (ref 1.4–7.7)
Neutrophils Relative %: 70.1 % (ref 43.0–77.0)
Platelets: 163 10*3/uL (ref 150.0–400.0)
RBC: 3.31 Mil/uL — ABNORMAL LOW (ref 3.87–5.11)
RDW: 16.1 % — ABNORMAL HIGH (ref 11.5–15.5)
WBC: 10.3 10*3/uL (ref 4.0–10.5)

## 2021-03-31 LAB — LIPID PANEL
Cholesterol: 157 mg/dL (ref 0–200)
HDL: 79.3 mg/dL (ref >39.00)
LDL Cholesterol: 58 mg/dL (ref 0–99)
NonHDL: 77.87
Total CHOL/HDL Ratio: 2
Triglycerides: 99 mg/dL (ref 0.0–149.0)
VLDL: 19.8 mg/dL (ref 0.0–40.0)

## 2021-03-31 LAB — VITAMIN D 25 HYDROXY (VIT D DEFICIENCY, FRACTURES): VITD: 119.78 ng/mL (ref 30.00–100.00)

## 2021-03-31 LAB — BASIC METABOLIC PANEL
BUN: 33 mg/dL — ABNORMAL HIGH (ref 6–23)
CO2: 27 mEq/L (ref 19–32)
Calcium: 10.4 mg/dL (ref 8.4–10.5)
Chloride: 101 mEq/L (ref 96–112)
Creatinine, Ser: 1.5 mg/dL — ABNORMAL HIGH (ref 0.40–1.20)
GFR: 34.74 mL/min — ABNORMAL LOW (ref >60.00)
Glucose, Bld: 94 mg/dL (ref 70–99)
Potassium: 4.4 mEq/L (ref 3.5–5.1)
Sodium: 137 mEq/L (ref 135–145)

## 2021-03-31 LAB — HEMOGLOBIN A1C: Hgb A1c MFr Bld: 5 % (ref 4.6–6.5)

## 2021-03-31 LAB — TSH: TSH: 1.83 u[IU]/mL (ref 0.35–5.50)

## 2021-03-31 LAB — VITAMIN B12: Vitamin B-12: 289 pg/mL (ref 211–911)

## 2021-03-31 NOTE — Assessment & Plan Note (Signed)
Lab Results  Component Value Date   LDLCALC 103 (H) 02/21/2020   uncontrolled pt to continue current statin lipitor 10, low chol diet, and f/u lab today

## 2021-03-31 NOTE — Assessment & Plan Note (Signed)
.   Lab Results  Component Value Date   CREATININE 1.50 (H) 03/31/2021   Stable overall, cont to avoid nephrotoxins

## 2021-03-31 NOTE — Telephone Encounter (Signed)
I received a call from [facility] for a critical lab value - vitamin D level is 119.78   Can wait for PCP to address tomorrow.

## 2021-03-31 NOTE — Assessment & Plan Note (Signed)
BP Readings from Last 3 Encounters:  03/31/21 (!) 116/56  02/21/20 130/60  03/23/19 126/78   Stable, pt to continue medical treatment hct, lisinopril

## 2021-03-31 NOTE — Assessment & Plan Note (Signed)
Mild overall, declines need for change in tx today

## 2021-03-31 NOTE — Assessment & Plan Note (Signed)
Stable overall, cont symbicort

## 2021-03-31 NOTE — Patient Instructions (Addendum)
You had the flu shot today  Please continue all other medications as before, and refills have been done if requested.  Please have the pharmacy call with any other refills you may need.  Please continue your efforts at being more active, low cholesterol diet, and weight control.  You are otherwise up to date with prevention measures today.  Please keep your appointments with your specialists as you may have planned- Dr Allena Katz - renal  Please go to the LAB at the blood drawing area for the tests to be done  You will be contacted by phone if any changes need to be made immediately.  Otherwise, you will receive a letter about your results with an explanation, but please check with MyChart first.  Please remember to sign up for MyChart if you have not done so, as this will be important to you in the future with finding out test results, communicating by private email, and scheduling acute appointments online when needed.  Please make an Appointment to return for your 1 year visit, or sooner if needed

## 2021-03-31 NOTE — Assessment & Plan Note (Signed)
Age and sex appropriate education and counseling updated with regular exercise and diet Referrals for preventative services - none needed Immunizations addressed - declines covid booster, shingrix, tdap Smoking counseling  - none needed Evidence for depression or other mood disorder - none significant Most recent labs reviewed. I have personally reviewed and have noted: 1) the patient's medical and social history 2) The patient's current medications and supplements 3) The patient's height, weight, and BMI have been recorded in the chart  

## 2021-03-31 NOTE — Assessment & Plan Note (Signed)
Lab Results  Component Value Date   HGBA1C 5.0 03/31/2021   Stable, pt to continue current medical treatment  - diet

## 2021-03-31 NOTE — Progress Notes (Signed)
Patient ID: [name] [name], female   DOB: 02-16-1951, 70 y.o.   MRN: [id number]         Chief Complaint:: wellness exam and hld, hyperglycemia, ckd, anxiety       HPI:  [name] [name] is a 70 y.o. female here for wellness exam; declines covid booster, shingirx, tdap, o/w up to date with preventive referrals and immnizations.                         Also Pt denies chest pain, increased sob or doe, wheezing, orthopnea, PND, increased LE swelling, palpitations, dizziness or syncope.   Pt denies polydipsia, polyuria, or new focal neuro s/s.  Tolerating new statin well.  Denies worsening depressive symptoms, suicidal ideation, or panic; trying to follow low chol diet.  No other new complaints      Wt Readings from Last 3 Encounters:  03/31/21 233 lb (105.7 kg)  02/21/20 235 lb (106.6 kg)  03/23/19 236 lb (107 kg)   BP Readings from Last 3 Encounters:  03/31/21 (!) 116/56  02/21/20 130/60  03/23/19 126/78   Immunization History  Administered Date(s) Administered   Fluad Quad(high Dose 65+) 03/23/2019, 03/31/2021   Influenza, High Dose Seasonal PF 05/24/2017, 05/19/2018   Influenza, Seasonal, Injecte, Preservative Fre 07/18/2012   Influenza,inj,Quad PF,6+ Mos 08/23/2013, 08/14/2015   Moderna Sars-Covid-2 Vaccination 08/22/2019, 09/19/2019, 04/21/2020, 10/20/2020   Pneumococcal Conjugate-13 01/01/2014   Pneumococcal Polysaccharide-23 02/11/2011, 02/17/2018   Tdap 02/11/2011   Zoster, Live 01/15/2014   There are no preventive care reminders to display for this patient.     Past Medical History:  Diagnosis Date   Arthritis    knee   Eczema 10/16/2014   Hypertension    Obesity    Past Surgical History:  Procedure Laterality Date   ANAL FISSURE REPAIR  1998   ARTHROSCOPIC REPAIR ACL Right 2010   DIAGNOSTIC MAMMOGRAM  2013   MYOMECTOMY  1985   TOTAL ABDOMINAL HYSTERECTOMY  1995    reports that she has never smoked. She has never used smokeless tobacco. She reports that she  does not drink alcohol and does not use drugs. family history includes COPD in her father; Coronary artery disease in her mother; Heart disease in her maternal grandfather; Hyperlipidemia in her mother; Hypertension in her maternal grandmother and mother; Lupus in her mother; Stroke in her maternal grandmother, paternal grandfather, and paternal grandmother. Allergies  Allergen Reactions   Indomethacin Other (See Comments)    The reaction is more of an intolerance. Patient states that she has nausea, dizziness and agitation.    Current Outpatient Medications on File Prior to Visit  Medication Sig Dispense Refill   albuterol (PROVENTIL HFA;VENTOLIN HFA) 108 (90 Base) MCG/ACT inhaler Inhale 2 puffs into the lungs every 6 (six) hours as needed for wheezing or shortness of breath. 1 Inhaler 5   allopurinol (ZYLOPRIM) 100 MG tablet Take 2 tablets by mouth daily.     aspirin 81 MG tablet Take 81 mg by mouth daily.       atorvastatin (LIPITOR) 10 MG tablet TAKE 1 TABLET EVERY DAY 90 tablet 3   budesonide-formoterol (SYMBICORT) 160-4.5 MCG/ACT inhaler Inhale 2 puffs into the lungs 2 (two) times daily. 3 Inhaler 3   Cholecalciferol 125 MCG (5000 UT) TABS Take by mouth.     Colchicine 0.6 MG CAPS Take 1 capsule by mouth daily.     hydrochlorothiazide (HYDRODIURIL) 25 MG tablet TAKE 1 TABLET(25 MG) BY  MOUTH DAILY 90 tablet 2   lisinopril (ZESTRIL) 20 MG tablet TAKE 1 TABLET(20 MG) BY MOUTH DAILY 90 tablet 2   PREDNISONE PO Take by mouth as needed.     No current facility-administered medications on file prior to visit.        ROS:  All others reviewed and negative.  Objective        PE:  BP (!) 116/56 (BP Location: Right Arm, Patient Position: Sitting, Cuff Size: Large)   Pulse 74   Temp 98.7 F (37.1 C) (Oral)   Ht 5\' 8"  (1.727 m)   Wt 233 lb (105.7 kg)   SpO2 97%   BMI 35.43 kg/m                 Constitutional: Pt appears in NAD               HENT: Head: NCAT.                Right Ear:  External ear normal.                 Left Ear: External ear normal.                Eyes: . Pupils are equal, round, and reactive to light. Conjunctivae and EOM are normal               Nose: without d/c or deformity               Neck: Neck supple. Gross normal ROM               Cardiovascular: Normal rate and regular rhythm.                 Pulmonary/Chest: Effort normal and breath sounds without rales or wheezing.                Abd:  Soft, NT, ND, + BS, no organomegaly               Neurological: Pt is alert. At baseline orientation, motor grossly intact               Skin: Skin is warm. No rashes, no other new lesions, LE edema - none               Psychiatric: Pt behavior is normal without agitation   Micro: none  Cardiac tracings I have personally interpreted today:  none  Pertinent Radiological findings (summarize): none   Lab Results  Component Value Date   WBC 10.3 03/31/2021   HGB 11.7 (L) 03/31/2021   HCT 33.2 (L) 03/31/2021   PLT 163.0 03/31/2021   GLUCOSE 94 03/31/2021   CHOL 157 03/31/2021   TRIG 99.0 03/31/2021   HDL 79.30 03/31/2021   LDLDIRECT 84.0 02/17/2018   LDLCALC 58 03/31/2021   ALT 17 03/31/2021   AST 20 03/31/2021   NA 137 03/31/2021   K 4.4 03/31/2021   CL 101 03/31/2021   CREATININE 1.50 (H) 03/31/2021   BUN 33 (H) 03/31/2021   CO2 27 03/31/2021   TSH 1.83 03/31/2021   HGBA1C 5.0 03/31/2021   Assessment/Plan:  [name] [name] is a 70 y.o. White or Caucasian [1] female with  has a past medical history of Arthritis, Eczema (10/16/2014), Hypertension, and Obesity.  HLD (hyperlipidemia) Lab Results  Component Value Date   LDLCALC 103 (H) 02/21/2020   uncontrolled pt to continue current statin lipitor 10, low chol diet, and  f/u lab today   Essential hypertension BP Readings from Last 3 Encounters:  03/31/21 (!) 116/56  02/21/20 130/60  03/23/19 126/78   Stable, pt to continue medical treatment hct, lisinopril   Hyperglycemia Lab  Results  Component Value Date   HGBA1C 5.0 03/31/2021   Stable, pt to continue current medical treatment  - diet   CKD (chronic kidney disease) stage 3, GFR 30-59 ml/min (HCC) . Lab Results  Component Value Date   CREATININE 1.50 (H) 03/31/2021   Stable overall, cont to avoid nephrotoxins   Anxiety state Mild overall, declines need for change in tx today  Asthma Stable overall, cont symbicort  Encounter for well adult exam with abnormal findings Age and sex appropriate education and counseling updated with regular exercise and diet Referrals for preventative services - none needed Immunizations addressed - declines covid booster, shingrix, tdap Smoking counseling  - none needed Evidence for depression or other mood disorder - none significant Most recent labs reviewed. I have personally reviewed and have noted: 1) the patient's medical and social history 2) The patient's current medications and supplements 3) The patient's height, weight, and BMI have been recorded in the chart  Followup: Return in about 1 year (around 03/31/2022).  Oliver Barre, MD 03/31/2021 8:08 PM [facility]

## 2021-04-02 ENCOUNTER — Other Ambulatory Visit: Payer: Medicare PPO

## 2021-04-02 LAB — URINALYSIS, ROUTINE W REFLEX MICROSCOPIC
Bilirubin Urine: NEGATIVE
Hgb urine dipstick: NEGATIVE
Ketones, ur: NEGATIVE
Leukocytes,Ua: NEGATIVE
Nitrite: NEGATIVE
RBC / HPF: NONE SEEN (ref 0–?)
Specific Gravity, Urine: 1.01 (ref 1.000–1.030)
Total Protein, Urine: NEGATIVE
Urine Glucose: NEGATIVE
Urobilinogen, UA: 0.2 (ref 0.0–1.0)
pH: 6 (ref 5.0–8.0)

## 2021-04-02 NOTE — Addendum Note (Signed)
Addended by: Waldemar Dickens B on: 04/02/2021 04:07 PM   Modules accepted: Orders

## 2021-05-21 DIAGNOSIS — Z961 Presence of intraocular lens: Secondary | ICD-10-CM | POA: Diagnosis not present

## 2021-06-01 DIAGNOSIS — Z1231 Encounter for screening mammogram for malignant neoplasm of breast: Secondary | ICD-10-CM | POA: Diagnosis not present

## 2021-06-01 LAB — HM MAMMOGRAPHY

## 2021-06-30 DIAGNOSIS — E669 Obesity, unspecified: Secondary | ICD-10-CM | POA: Diagnosis not present

## 2021-06-30 DIAGNOSIS — M1009 Idiopathic gout, multiple sites: Secondary | ICD-10-CM | POA: Diagnosis not present

## 2021-06-30 DIAGNOSIS — M5431 Sciatica, right side: Secondary | ICD-10-CM | POA: Diagnosis not present

## 2021-06-30 DIAGNOSIS — Z6836 Body mass index (BMI) 36.0-36.9, adult: Secondary | ICD-10-CM | POA: Diagnosis not present

## 2021-06-30 DIAGNOSIS — L409 Psoriasis, unspecified: Secondary | ICD-10-CM | POA: Diagnosis not present

## 2021-06-30 DIAGNOSIS — M150 Primary generalized (osteo)arthritis: Secondary | ICD-10-CM | POA: Diagnosis not present

## 2021-06-30 DIAGNOSIS — Z79899 Other long term (current) drug therapy: Secondary | ICD-10-CM | POA: Diagnosis not present

## 2021-07-21 DIAGNOSIS — N1832 Chronic kidney disease, stage 3b: Secondary | ICD-10-CM | POA: Diagnosis not present

## 2021-07-27 DIAGNOSIS — D631 Anemia in chronic kidney disease: Secondary | ICD-10-CM | POA: Diagnosis not present

## 2021-07-27 DIAGNOSIS — N2581 Secondary hyperparathyroidism of renal origin: Secondary | ICD-10-CM | POA: Diagnosis not present

## 2021-07-27 DIAGNOSIS — N1832 Chronic kidney disease, stage 3b: Secondary | ICD-10-CM | POA: Diagnosis not present

## 2021-07-27 DIAGNOSIS — I129 Hypertensive chronic kidney disease with stage 1 through stage 4 chronic kidney disease, or unspecified chronic kidney disease: Secondary | ICD-10-CM | POA: Diagnosis not present

## 2021-07-30 DIAGNOSIS — M25562 Pain in left knee: Secondary | ICD-10-CM | POA: Diagnosis not present

## 2021-12-29 DIAGNOSIS — M1009 Idiopathic gout, multiple sites: Secondary | ICD-10-CM | POA: Diagnosis not present

## 2021-12-29 DIAGNOSIS — L409 Psoriasis, unspecified: Secondary | ICD-10-CM | POA: Diagnosis not present

## 2021-12-29 DIAGNOSIS — E669 Obesity, unspecified: Secondary | ICD-10-CM | POA: Diagnosis not present

## 2021-12-29 DIAGNOSIS — Z6835 Body mass index (BMI) 35.0-35.9, adult: Secondary | ICD-10-CM | POA: Diagnosis not present

## 2021-12-29 DIAGNOSIS — R768 Other specified abnormal immunological findings in serum: Secondary | ICD-10-CM | POA: Diagnosis not present

## 2021-12-29 DIAGNOSIS — M1991 Primary osteoarthritis, unspecified site: Secondary | ICD-10-CM | POA: Diagnosis not present

## 2021-12-29 DIAGNOSIS — Z79899 Other long term (current) drug therapy: Secondary | ICD-10-CM | POA: Diagnosis not present

## 2022-01-06 ENCOUNTER — Other Ambulatory Visit: Payer: Self-pay

## 2022-01-06 MED ORDER — LISINOPRIL 20 MG PO TABS: ORAL_TABLET | ORAL | 0 refills | Status: DC

## 2022-01-06 MED ORDER — HYDROCHLOROTHIAZIDE 25 MG PO TABS: ORAL_TABLET | ORAL | 0 refills | Status: DC

## 2022-01-18 DIAGNOSIS — D692 Other nonthrombocytopenic purpura: Secondary | ICD-10-CM | POA: Diagnosis not present

## 2022-01-18 DIAGNOSIS — L280 Lichen simplex chronicus: Secondary | ICD-10-CM | POA: Diagnosis not present

## 2022-01-18 DIAGNOSIS — Z85828 Personal history of other malignant neoplasm of skin: Secondary | ICD-10-CM | POA: Diagnosis not present

## 2022-01-18 DIAGNOSIS — L089 Local infection of the skin and subcutaneous tissue, unspecified: Secondary | ICD-10-CM | POA: Diagnosis not present

## 2022-01-18 DIAGNOSIS — L281 Prurigo nodularis: Secondary | ICD-10-CM | POA: Diagnosis not present

## 2022-01-23 DIAGNOSIS — L281 Prurigo nodularis: Secondary | ICD-10-CM | POA: Insufficient documentation

## 2022-01-28 DIAGNOSIS — Z79899 Other long term (current) drug therapy: Secondary | ICD-10-CM | POA: Diagnosis not present

## 2022-01-28 DIAGNOSIS — E790 Hyperuricemia without signs of inflammatory arthritis and tophaceous disease: Secondary | ICD-10-CM | POA: Diagnosis not present

## 2022-02-10 ENCOUNTER — Other Ambulatory Visit: Payer: Self-pay | Admitting: Internal Medicine

## 2022-02-10 NOTE — Telephone Encounter (Signed)
Please refill as per office routine med refill policy (all routine meds to be refilled for 3 mo or monthly (per pt preference) up to one year from last visit, then month to month grace period for 3 mo, then further med refills will have to be denied) ? ?

## 2022-03-30 ENCOUNTER — Encounter: Payer: Medicare PPO | Admitting: Internal Medicine

## 2022-04-02 ENCOUNTER — Encounter: Payer: Self-pay | Admitting: Internal Medicine

## 2022-04-02 ENCOUNTER — Telehealth: Payer: Self-pay

## 2022-04-02 ENCOUNTER — Ambulatory Visit (INDEPENDENT_AMBULATORY_CARE_PROVIDER_SITE_OTHER): Payer: Medicare PPO | Admitting: Internal Medicine

## 2022-04-02 ENCOUNTER — Ambulatory Visit (INDEPENDENT_AMBULATORY_CARE_PROVIDER_SITE_OTHER): Payer: Medicare PPO

## 2022-04-02 VITALS — BP 118/60 | HR 91 | Temp 97.4°F | Ht 68.0 in | Wt 239.6 lb

## 2022-04-02 VITALS — BP 118/60 | HR 91 | Temp 97.4°F | Ht 68.0 in | Wt 239.0 lb

## 2022-04-02 DIAGNOSIS — E559 Vitamin D deficiency, unspecified: Secondary | ICD-10-CM | POA: Diagnosis not present

## 2022-04-02 DIAGNOSIS — R739 Hyperglycemia, unspecified: Secondary | ICD-10-CM

## 2022-04-02 DIAGNOSIS — I10 Essential (primary) hypertension: Secondary | ICD-10-CM | POA: Diagnosis not present

## 2022-04-02 DIAGNOSIS — N1831 Chronic kidney disease, stage 3a: Secondary | ICD-10-CM | POA: Diagnosis not present

## 2022-04-02 DIAGNOSIS — Z23 Encounter for immunization: Secondary | ICD-10-CM

## 2022-04-02 DIAGNOSIS — E7800 Pure hypercholesterolemia, unspecified: Secondary | ICD-10-CM | POA: Diagnosis not present

## 2022-04-02 DIAGNOSIS — Z13820 Encounter for screening for osteoporosis: Secondary | ICD-10-CM

## 2022-04-02 DIAGNOSIS — Z0000 Encounter for general adult medical examination without abnormal findings: Secondary | ICD-10-CM

## 2022-04-02 DIAGNOSIS — Z0001 Encounter for general adult medical examination with abnormal findings: Secondary | ICD-10-CM

## 2022-04-02 DIAGNOSIS — R32 Unspecified urinary incontinence: Secondary | ICD-10-CM | POA: Diagnosis not present

## 2022-04-02 LAB — CBC WITH DIFFERENTIAL/PLATELET
Basophils Absolute: 0.1 10*3/uL (ref 0.0–0.1)
Basophils Relative: 0.8 % (ref 0.0–3.0)
Eosinophils Absolute: 0.3 10*3/uL (ref 0.0–0.7)
Eosinophils Relative: 3.7 % (ref 0.0–5.0)
HCT: 32.6 % — ABNORMAL LOW (ref 36.0–46.0)
Hemoglobin: 11.6 g/dL — ABNORMAL LOW (ref 12.0–15.0)
Lymphocytes Relative: 24.2 % (ref 12.0–46.0)
Lymphs Abs: 1.8 10*3/uL (ref 0.7–4.0)
MCHC: 35.5 g/dL (ref 30.0–36.0)
MCV: 98.9 fl (ref 78.0–100.0)
Monocytes Absolute: 0.4 10*3/uL (ref 0.1–1.0)
Monocytes Relative: 5 % (ref 3.0–12.0)
Neutro Abs: 4.8 10*3/uL (ref 1.4–7.7)
Neutrophils Relative %: 66.3 % (ref 43.0–77.0)
Platelets: 127 10*3/uL — ABNORMAL LOW (ref 150.0–400.0)
RBC: 3.3 Mil/uL — ABNORMAL LOW (ref 3.87–5.11)
RDW: 15.8 % — ABNORMAL HIGH (ref 11.5–15.5)
WBC: 7.2 10*3/uL (ref 4.0–10.5)

## 2022-04-02 LAB — BASIC METABOLIC PANEL
BUN: 23 mg/dL (ref 6–23)
CO2: 27 mEq/L (ref 19–32)
Calcium: 9.8 mg/dL (ref 8.4–10.5)
Chloride: 101 mEq/L (ref 96–112)
Creatinine, Ser: 1.46 mg/dL — ABNORMAL HIGH (ref 0.40–1.20)
GFR: 35.63 mL/min — ABNORMAL LOW (ref >60.00)
Glucose, Bld: 96 mg/dL (ref 70–99)
Potassium: 4 mEq/L (ref 3.5–5.1)
Sodium: 137 mEq/L (ref 135–145)

## 2022-04-02 LAB — LIPID PANEL
Cholesterol: 135 mg/dL (ref 0–200)
HDL: 63.9 mg/dL (ref >39.00)
LDL Cholesterol: 46 mg/dL (ref 0–99)
NonHDL: 71.24
Total CHOL/HDL Ratio: 2
Triglycerides: 125 mg/dL (ref 0.0–149.0)
VLDL: 25 mg/dL (ref 0.0–40.0)

## 2022-04-02 LAB — VITAMIN B12: Vitamin B-12: 292 pg/mL (ref 211–911)

## 2022-04-02 LAB — HEPATIC FUNCTION PANEL
ALT: 16 U/L (ref 0–35)
AST: 21 U/L (ref 0–37)
Albumin: 4.3 g/dL (ref 3.5–5.2)
Alkaline Phosphatase: 66 U/L (ref 39–117)
Bilirubin, Direct: 0.2 mg/dL (ref 0.0–0.3)
Total Bilirubin: 0.9 mg/dL (ref 0.2–1.2)
Total Protein: 7.3 g/dL (ref 6.0–8.3)

## 2022-04-02 LAB — TSH: TSH: 2.01 u[IU]/mL (ref 0.35–5.50)

## 2022-04-02 LAB — VITAMIN D 25 HYDROXY (VIT D DEFICIENCY, FRACTURES): VITD: 106.41 ng/mL (ref 30.00–100.00)

## 2022-04-02 LAB — HEMOGLOBIN A1C: Hgb A1c MFr Bld: 5.6 % (ref 4.6–6.5)

## 2022-04-02 NOTE — Patient Instructions (Addendum)
Please have your Shingrix (shingles) shots done at your local pharmacy.  You will be contacted regarding the referral for: urology  You had the flu shot today  Please continue all other medications as before, and refills have been done if requested.  Please have the pharmacy call with any other refills you may need.  Please continue your efforts at being more active, low cholesterol diet, and weight control.  You are otherwise up to date with prevention measures today.  Please keep your appointments with your specialists as you may have planned  Please go to the LAB at the blood drawing area for the tests to be done  You will be contacted by phone if any changes need to be made immediately.  Otherwise, you will receive a letter about your results with an explanation, but please check with MyChart first.  Please remember to sign up for MyChart if you have not done so, as this will be important to you in the future with finding out test results, communicating by private email, and scheduling acute appointments online when needed.  Please make an Appointment to return for your 1 year visit, or sooner if needed

## 2022-04-02 NOTE — Progress Notes (Unsigned)
Patient ID: [name] [name], female   DOB: 29-Mar-1951, 71 y.o.   MRN: [id number]         Chief Complaint:: wellness exam and cld, htn, hld, hyperglycemia       HPI:  [name] [name] is a 71 y.o. female here for wellness exam; decliens covid booster, shingrix, tdap o/w up to date.  Due for flu shot                        Also to see Dr Allena [facility] in about 2 wks, seeing twice per yr.  Rheum increased allopurinol to 300 mg .  Pt denies chest pain, increased sob or doe, wheezing, orthopnea, PND, increased LE swelling, palpitations, dizziness or syncope.   Pt denies polydipsia, polyuria, or new focal neuro s/s.    Pt denies fever, wt loss, night sweats, loss of appetite, or other constitutional symptoms  Does have worsening incontinence, needs urology referral as well, o/w Denies urinary symptoms such as dysuria, frequency, urgency, flank pain, hematuria .  Also sees dermatology at least yearly.  Not taking Vit D   Wt Readings from Last 3 Encounters:  04/02/22 239 lb (108.4 kg)  04/02/22 239 lb 9.6 oz (108.7 kg)  03/31/21 233 lb (105.7 kg)   BP Readings from Last 3 Encounters:  04/02/22 118/60  04/02/22 118/60  03/31/21 (!) 116/56   Immunization History  Administered Date(s) Administered   Fluad Quad(high Dose 65+) 03/23/2019, 03/31/2021, 04/02/2022   Influenza, High Dose Seasonal PF 05/24/2017, 05/19/2018   Influenza, Seasonal, Injecte, Preservative Fre 07/18/2012   Influenza,inj,Quad PF,6+ Mos 08/23/2013, 08/14/2015   Moderna Covid-19 Vaccine Bivalent Booster 87yrs & up 05/26/2021   Moderna Sars-Covid-2 Vaccination 08/22/2019, 09/19/2019, 04/21/2020, 10/20/2020   Pneumococcal Conjugate-13 01/01/2014   Pneumococcal Polysaccharide-23 02/11/2011, 02/17/2018   Tdap 02/11/2011   Zoster, Live 01/15/2014  There are no preventive care reminders to display for this patient.    Past Medical History:  Diagnosis Date   Arthritis    knee   Eczema 10/16/2014   Hypertension    Obesity     Past Surgical History:  Procedure Laterality Date   ANAL FISSURE REPAIR  1998   ARTHROSCOPIC REPAIR ACL Right 2010   DIAGNOSTIC MAMMOGRAM  2013   MYOMECTOMY  1985   TOTAL ABDOMINAL HYSTERECTOMY  1995    reports that she has never smoked. She has never used smokeless tobacco. She reports that she does not drink alcohol and does not use drugs. family history includes COPD in her father; Coronary artery disease in her mother; Heart disease in her maternal grandfather; Hyperlipidemia in her mother; Hypertension in her maternal grandmother and mother; Lupus in her mother; Stroke in her maternal grandmother, paternal grandfather, and paternal grandmother. Allergies  Allergen Reactions   Indomethacin Other (See Comments)    The reaction is more of an intolerance. Patient states that she has nausea, dizziness and agitation.    Current Outpatient Medications on File Prior to Visit  Medication Sig Dispense Refill   albuterol (PROVENTIL HFA;VENTOLIN HFA) 108 (90 Base) MCG/ACT inhaler Inhale 2 puffs into the lungs every 6 (six) hours as needed for wheezing or shortness of breath. 1 Inhaler 5   allopurinol (ZYLOPRIM) 300 MG tablet Take 1 tablet by mouth daily.     aspirin 81 MG tablet Take 81 mg by mouth daily.       atorvastatin (LIPITOR) 10 MG tablet TAKE 1 TABLET EVERY DAY 90 tablet 0  budesonide-formoterol (SYMBICORT) 160-4.5 MCG/ACT inhaler Inhale 2 puffs into the lungs 2 (two) times daily. 3 Inhaler 3   Cholecalciferol 125 MCG (5000 UT) TABS Take by mouth.     Colchicine 0.6 MG CAPS Take 1 capsule by mouth daily.     hydrochlorothiazide (HYDRODIURIL) 25 MG tablet TAKE 1 TABLET(25 MG) BY MOUTH DAILY 90 tablet 0   lisinopril (ZESTRIL) 20 MG tablet TAKE 1 TABLET(20 MG) BY MOUTH DAILY 90 tablet 0   PREDNISONE PO Take by mouth as needed.     No current facility-administered medications on file prior to visit.        ROS:  All others reviewed and negative.  Objective        PE:  BP 118/60  (BP Location: Right Arm, Patient Position: Sitting, Cuff Size: Large)   Pulse 91   Temp (!) 97.4 F (36.3 C) (Oral)   Ht 5\' 8"  (1.727 m)   Wt 239 lb (108.4 kg)   SpO2 95%   BMI 36.34 kg/m                 Constitutional: Pt appears in NAD               HENT: Head: NCAT.                Right Ear: External ear normal.                 Left Ear: External ear normal.                Eyes: . Pupils are equal, round, and reactive to light. Conjunctivae and EOM are normal               Nose: without d/c or deformity               Neck: Neck supple. Gross normal ROM               Cardiovascular: Normal rate and regular rhythm.                 Pulmonary/Chest: Effort normal and breath sounds without rales or wheezing.                Abd:  Soft, NT, ND, + BS, no organomegaly               Neurological: Pt is alert. At baseline orientation, motor grossly intact               Skin: Skin is warm. No rashes, no other new lesions, LE edema - none               Psychiatric: Pt behavior is normal without agitation   Micro: none  Cardiac tracings I have personally interpreted today:  none  Pertinent Radiological findings (summarize): none   Lab Results  Component Value Date   WBC 7.2 04/02/2022   HGB 11.6 (L) 04/02/2022   HCT 32.6 (L) 04/02/2022   PLT 127.0 (L) 04/02/2022   GLUCOSE 96 04/02/2022   CHOL 135 04/02/2022   TRIG 125.0 04/02/2022   HDL 63.90 04/02/2022   LDLDIRECT 84.0 02/17/2018   LDLCALC 46 04/02/2022   ALT 16 04/02/2022   AST 21 04/02/2022   NA 137 04/02/2022   K 4.0 04/02/2022   CL 101 04/02/2022   CREATININE 1.46 (H) 04/02/2022   BUN 23 04/02/2022   CO2 27 04/02/2022   TSH 2.01 04/02/2022   HGBA1C 5.6 04/02/2022  Assessment/Plan:  [name] [name] is a 71 y.o. White or Caucasian [1] female with  has a past medical history of Arthritis, Eczema (10/16/2014), Hypertension, and Obesity.  Encounter for well adult exam with abnormal findings Age and sex appropriate  education and counseling updated with regular exercise and diet Referrals for preventative services - none needed Immunizations addressed - declines covid booster, shingrix, tdap, but ok for flu shot today Smoking counseling  - none needed Evidence for depression or other mood disorder - none significant Most recent labs reviewed. I have personally reviewed and have noted: 1) the patient's medical and social history 2) The patient's current medications and supplements 3) The patient's height, weight, and BMI have been recorded in the chart   CKD (chronic kidney disease) stage 3, GFR 30-59 ml/min (HCC) Lab Results  Component Value Date   CREATININE 1.46 (H) 04/02/2022   Stable overall, cont to avoid nephrotoxins   Essential hypertension BP Readings from Last 3 Encounters:  04/02/22 118/60  04/02/22 118/60  03/31/21 (!) 116/56   Stable, pt to continue medical treatment lisinopril 20 mg qd, hct 25 mg qd   HLD (hyperlipidemia) Lab Results  Component Value Date   LDLCALC 46 04/02/2022   Stable, pt to continue current statin lipitor 10 mg qd   Hyperglycemia Lab Results  Component Value Date   HGBA1C 5.6 04/02/2022   Stable, pt to continue current medical treatment  - diet, wt control, excercise   Vitamin D deficiency Last vitamin D Lab Results  Component Value Date   VD25OH 106.41 ([place]) 04/02/2022   Overcontrolled, ok to reduce oral replacement to mon - wed - fri only  Urinary incontinence Uncontrolled, recent worsening, no s/s infection, for urology referral  Followup: Return in about 1 year (around 04/03/2023).  Cathlean Cower, MD 04/04/2022 8:26 PM [facility]

## 2022-04-02 NOTE — Patient Instructions (Addendum)
[name] [name] , Thank you for taking time to come for your Medicare Wellness Visit. I appreciate your ongoing commitment to your health goals. Please review the following plan we discussed and let me know if I can assist you in the future.   Screening recommendations/referrals: Colonoscopy: 04/24/2014; due every 10 years Mammogram: 06/01/2021; due every year Bone Density: 02/23/2017; due every 5 years (2023) Recommended yearly ophthalmology/optometry visit for glaucoma screening and checkup Recommended yearly dental visit for hygiene and checkup  Vaccinations: Influenza vaccine: due Fall season 2023 Pneumococcal vaccine: 01/01/2014, 02/17/2018 Tdap vaccine: 02/11/2011; due every 10 years (overdue) Shingles vaccine: due   Covid-19: 08/22/2019, 09/19/2019, 05/31/2020, 12/22/2020, 05/26/2021  Advanced directives: Yes  Conditions/risks identified: Yes; My goal is to not to fall.  Next appointment: Follow up in one year for your annual wellness visit.   Preventive Care 27 Years and Older, Female Preventive care refers to lifestyle choices and visits with your health care provider that can promote health and wellness. What does preventive care include? A yearly physical exam. This is also called an annual well check. Dental exams once or twice a year. Routine eye exams. Ask your health care provider how often you should have your eyes checked. Personal lifestyle choices, including: Daily care of your teeth and gums. Regular physical activity. Eating a healthy diet. Avoiding tobacco and drug use. Limiting alcohol use. Practicing safe sex. Taking low-dose aspirin every day. Taking vitamin and mineral supplements as recommended by your health care provider. What happens during an annual well check? The services and screenings done by your health care provider during your annual well check will depend on your age, overall health, lifestyle risk factors, and family history of disease. Counseling  Your  health care provider may ask you questions about your: Alcohol use. Tobacco use. Drug use. Emotional well-being. Home and relationship well-being. Sexual activity. Eating habits. History of falls. Memory and ability to understand (cognition). Work and work Astronomer. Reproductive health. Screening  You may have the following tests or measurements: Height, weight, and BMI. Blood pressure. Lipid and cholesterol levels. These may be checked every 5 years, or more frequently if you are over 59 years old. Skin check. Lung cancer screening. You may have this screening every year starting at age 13 if you have a 30-pack-year history of smoking and currently smoke or have quit within the past 15 years. Fecal occult blood test (FOBT) of the stool. You may have this test every year starting at age 11. Flexible sigmoidoscopy or colonoscopy. You may have a sigmoidoscopy every 5 years or a colonoscopy every 10 years starting at age 57. Hepatitis C blood test. Hepatitis B blood test. Sexually transmitted disease (STD) testing. Diabetes screening. This is done by checking your blood sugar (glucose) after you have not eaten for a while (fasting). You may have this done every 1-3 years. Bone density scan. This is done to screen for osteoporosis. You may have this done starting at age 59. Mammogram. This may be done every 1-2 years. Talk to your health care provider about how often you should have regular mammograms. Talk with your health care provider about your test results, treatment options, and if necessary, the need for more tests. Vaccines  Your health care provider may recommend certain vaccines, such as: Influenza vaccine. This is recommended every year. Tetanus, diphtheria, and acellular pertussis (Tdap, Td) vaccine. You may need a Td booster every 10 years. Zoster vaccine. You may need this after age 55. Pneumococcal 13-valent conjugate (  PCV13) vaccine. One dose is recommended after age  52. Pneumococcal polysaccharide (PPSV23) vaccine. One dose is recommended after age 22. Talk to your health care provider about which screenings and vaccines you need and how often you need them. This information is not intended to replace advice given to you by your health care provider. Make sure you discuss any questions you have with your health care provider. Document Released: 08/01/2015 Document Revised: 03/24/2016 Document Reviewed: 05/06/2015 Elsevier Interactive Patient Education  2017 [employer] Prevention in the Home Falls can cause injuries. They can happen to people of all ages. There are many things you can do to make your home safe and to help prevent falls. What can I do on the outside of my home? Regularly fix the edges of walkways and driveways and fix any cracks. Remove anything that might make you trip as you walk through a door, such as a raised step or threshold. Trim any bushes or trees on the path to your home. Use bright outdoor lighting. Clear any walking paths of anything that might make someone trip, such as rocks or tools. Regularly check to see if handrails are loose or broken. Make sure that both sides of any steps have handrails. Any raised decks and porches should have guardrails on the edges. Have any leaves, snow, or ice cleared regularly. Use sand or salt on walking paths during winter. Clean up any spills in your garage right away. This includes oil or grease spills. What can I do in the bathroom? Use night lights. Install grab bars by the toilet and in the tub and shower. Do not use towel bars as grab bars. Use non-skid mats or decals in the tub or shower. If you need to sit down in the shower, use a plastic, non-slip stool. Keep the floor dry. Clean up any water that spills on the floor as soon as it happens. Remove soap buildup in the tub or shower regularly. Attach bath mats securely with double-sided non-slip rug tape. Do not have throw  rugs and other things on the floor that can make you trip. What can I do in the bedroom? Use night lights. Make sure that you have a light by your bed that is easy to reach. Do not use any sheets or blankets that are too big for your bed. They should not hang down onto the floor. Have a firm chair that has side arms. You can use this for support while you get dressed. Do not have throw rugs and other things on the floor that can make you trip. What can I do in the kitchen? Clean up any spills right away. Avoid walking on wet floors. Keep items that you use a lot in easy-to-reach places. If you need to reach something above you, use a strong step stool that has a grab bar. Keep electrical cords out of the way. Do not use floor polish or wax that makes floors slippery. If you must use wax, use non-skid floor wax. Do not have throw rugs and other things on the floor that can make you trip. What can I do with my stairs? Do not leave any items on the stairs. Make sure that there are handrails on both sides of the stairs and use them. Fix handrails that are broken or loose. Make sure that handrails are as long as the stairways. Check any carpeting to make sure that it is firmly attached to the stairs. Fix any carpet that is  loose or worn. Avoid having throw rugs at the top or bottom of the stairs. If you do have throw rugs, attach them to the floor with carpet tape. Make sure that you have a light switch at the top of the stairs and the bottom of the stairs. If you do not have them, ask someone to add them for you. What else can I do to help prevent falls? Wear shoes that: Do not have high heels. Have rubber bottoms. Are comfortable and fit you well. Are closed at the toe. Do not wear sandals. If you use a stepladder: Make sure that it is fully opened. Do not climb a closed stepladder. Make sure that both sides of the stepladder are locked into place. Ask someone to hold it for you, if  possible. Clearly mark and make sure that you can see: Any grab bars or handrails. First and last steps. Where the edge of each step is. Use tools that help you move around (mobility aids) if they are needed. These include: Canes. Walkers. Scooters. Crutches. Turn on the lights when you go into a dark area. Replace any light bulbs as soon as they burn out. Set up your furniture so you have a clear path. Avoid moving your furniture around. If any of your floors are uneven, fix them. If there are any pets around you, be aware of where they are. Review your medicines with your doctor. Some medicines can make you feel dizzy. This can increase your chance of falling. Ask your doctor what other things that you can do to help prevent falls. This information is not intended to replace advice given to you by your health care provider. Make sure you discuss any questions you have with your health care provider. Document Released: 05/01/2009 Document Revised: 12/11/2015 Document Reviewed: 08/09/2014 Elsevier Interactive Patient Education  2017 [employer].

## 2022-04-02 NOTE — Progress Notes (Signed)
Subjective:   [name] [name] is a 71 y.o. female who presents for Medicare Annual (Subsequent) preventive examination.  Review of Systems     Cardiac Risk Factors include: advanced age (>24men, >51 women);dyslipidemia;hypertension;obesity (BMI >30kg/m2);family history of premature cardiovascular disease     Objective:    Today's Vitals   04/02/22 1338  BP: 118/60  Pulse: 91  Temp: (!) 97.4 F (36.3 C)  SpO2: 95%  Weight: 239 lb 9.6 oz (108.7 kg)  Height: 5\' 8"  (1.727 m)  PainSc: 0-No pain   Body mass index is 36.43 kg/m.     04/02/2022    2:06 PM 04/10/2014   10:45 AM  Advanced Directives  Does Patient Have a Medical Advance Directive? Yes Yes  Type of 04/12/2014 of [place];Living will Living will;Healthcare Power of Attorney  Copy of Healthcare Power of Attorney in Chart? No - copy requested     Current Medications (verified) Outpatient Encounter Medications as of 04/02/2022  Medication Sig   albuterol (PROVENTIL HFA;VENTOLIN HFA) 108 (90 Base) MCG/ACT inhaler Inhale 2 puffs into the lungs every 6 (six) hours as needed for wheezing or shortness of breath.   allopurinol (ZYLOPRIM) 300 MG tablet Take 1 tablet by mouth daily.   aspirin 81 MG tablet Take 81 mg by mouth daily.     atorvastatin (LIPITOR) 10 MG tablet TAKE 1 TABLET EVERY DAY   budesonide-formoterol (SYMBICORT) 160-4.5 MCG/ACT inhaler Inhale 2 puffs into the lungs 2 (two) times daily.   Cholecalciferol 125 MCG (5000 UT) TABS Take by mouth.   Colchicine 0.6 MG CAPS Take 1 capsule by mouth daily.   hydrochlorothiazide (HYDRODIURIL) 25 MG tablet TAKE 1 TABLET(25 MG) BY MOUTH DAILY   lisinopril (ZESTRIL) 20 MG tablet TAKE 1 TABLET(20 MG) BY MOUTH DAILY   PREDNISONE PO Take by mouth as needed.   No facility-administered encounter medications on file as of 04/02/2022.    Allergies (verified) Indomethacin   History: Past Medical History:  Diagnosis Date   Arthritis    knee    Eczema 10/16/2014   Hypertension    Obesity    Past Surgical History:  Procedure Laterality Date   ANAL FISSURE REPAIR  1998   ARTHROSCOPIC REPAIR ACL Right 2010   DIAGNOSTIC MAMMOGRAM  2013   MYOMECTOMY  1985   TOTAL ABDOMINAL HYSTERECTOMY  1995   Family History  Problem Relation Age of Onset   Coronary artery disease Mother        CABG   Hypertension Mother    Hyperlipidemia Mother    Lupus Mother    COPD Father    Hypertension Maternal Grandmother    Stroke Maternal Grandmother    Heart disease Maternal Grandfather    Stroke Paternal Grandmother    Stroke Paternal Grandfather    Cancer Neg Hx        breast or colon   Colon cancer Neg Hx    Social History   Socioeconomic History   Marital status: Single    Spouse name: Not on file   Number of children: Not on file   Years of education: Not on file   Highest education level: Not on file  Occupational History   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 Single, has own home. 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 End of Life care: yes for CPR and short term mechanical ventilation but no long term artificial support.    Social Determinants of Health   Financial Resource Strain: Low Risk  (04/02/2022)   Overall Financial Resource Strain (CARDIA)    Difficulty of Paying Living Expenses: Not hard at all  Food Insecurity: No Food Insecurity (04/02/2022)   Hunger Vital Sign    Worried About Running Out of Food in the Last Year: Never true    Ran Out of Food in the Last Year: Never true  Transportation Needs: No Transportation Needs (04/02/2022)   PRAPARE - Hydrologist (Medical): No    Lack of Transportation (Non-Medical): No   Physical Activity: Insufficiently Active (04/02/2022)   Exercise Vital Sign    Days of Exercise per Week: 5 days    Minutes of Exercise per Session: 20 min  Stress: No Stress Concern Present (04/02/2022)   [place]    Feeling of Stress : Not at all  Social Connections: Moderately Integrated (04/02/2022)   Social Connection and Isolation Panel [NHANES]    Frequency of Communication with Friends and Family: More than three times a week    Frequency of Social Gatherings with Friends and Family: More than three times a week    Attends Religious Services: More than 4 times per year    Active Member of [employer] or Organizations: Yes    Attends Music therapist: More than 4 times per year    Marital Status: Never married    Tobacco Counseling Counseling given: Not Answered   Clinical Intake:  Pre-visit preparation completed: Yes  Pain : No/denies pain Pain Score: 0-No pain     BMI - recorded: 36.43 Nutritional Status: BMI > 30  Obese Nutritional Risks: None Diabetes: No  How often do you need to have someone help you when you read instructions, pamphlets, or other written materials from your doctor or pharmacy?: 1 - Never What is the last grade level you completed in school?: Master's Degree  Diabetic? no  Interpreter Needed?: No  Information entered by :: Lisette Abu, LPN.   Activities of Daily Living    04/02/2022    2:07 PM  In your present state of health, do you have any difficulty performing the following activities:  Hearing? 0  Vision? 0  Difficulty concentrating or making decisions? 0  Walking or climbing stairs? 0  Dressing or bathing? 0  Doing errands, shopping? 0  Preparing Food and eating ? N  Using the Toilet? N  In the past six months, have you accidently leaked urine? N  Do you have problems with loss of bowel control? N  Managing your Medications? N  Managing your  Finances? N  Housekeeping or managing your Housekeeping? N    Patient Care Team: Biagio Borg, MD as PCP - General (Internal Medicine) Fonnie Mu, [place] as Consulting Physician (Optometry)  Indicate any recent Medical Services you may have received from other than [facility] providers in the past year (date may be approximate).     Assessment:   This is a routine wellness examination for [name].  Hearing/Vision screen Hearing Screening - Comments:: Denies hearing difficulties   Vision Screening - Comments:: Wears rx glasses - up to date with routine eye exams with Fonnie Mu,  OD.   Dietary issues and exercise activities discussed: Current Exercise Habits: Home exercise routine, Type of exercise: walking, Time (Minutes): 20, Frequency (Times/Week): 5, Weekly Exercise (Minutes/Week): 100, Intensity: Mild, Exercise limited by: orthopedic condition(s)   Goals Addressed             This Visit's Progress    My goal is to prevent from falling by being very careful.        Depression Screen    04/02/2022    1:53 PM 03/31/2021    3:09 PM 03/31/2021    2:39 PM 02/21/2020    3:58 PM 02/21/2020    3:27 PM 02/20/2019    3:08 PM 02/17/2018    3:25 PM  PHQ 2/9 Scores  PHQ - 2 Score 0 0 0 0 0 0 0    Fall Risk    04/02/2022    2:07 PM 03/31/2021    3:09 PM 03/31/2021    2:39 PM 02/21/2020    3:58 PM 02/21/2020    3:27 PM  Fall Risk   Falls in the past year? 0 0 0 0 0  Number falls in past yr: 0 0 0  0  Injury with Fall? 0 0 0  0  Risk for fall due to : No Fall Risks    No Fall Risks  Follow up Falls prevention discussed;Falls evaluation completed    Falls evaluation completed    FALL RISK PREVENTION PERTAINING TO THE HOME:  Any stairs in or around the home? Yes  If so, are there any without handrails? No  Home free of loose throw rugs in walkways, pet beds, electrical cords, etc? Yes  Adequate lighting in your home to reduce risk of falls? Yes   ASSISTIVE DEVICES UTILIZED TO PREVENT  FALLS:  Life alert? No  Use of a cane, walker or w/c? No  Grab bars in the bathroom? Yes  Shower chair or bench in shower? Yes  Elevated toilet seat or a handicapped toilet? Yes   TIMED UP AND GO:  Was the test performed? Yes .  Length of time to ambulate 10 feet: 6 sec.   Gait steady and fast without use of assistive device  Cognitive Function:        04/02/2022    2:08 PM  6CIT Screen  What Year? 0 points  What month? 0 points  What time? 0 points  Count back from 20 0 points  Months in reverse 0 points  Repeat phrase 0 points  Total Score 0 points    Immunizations Immunization History  Administered Date(s) Administered   Fluad Quad(high Dose 65+) 03/23/2019, 03/31/2021   Influenza, High Dose Seasonal PF 05/24/2017, 05/19/2018   Influenza, Seasonal, Injecte, Preservative Fre 07/18/2012   Influenza,inj,Quad PF,6+ Mos 08/23/2013, 08/14/2015   Moderna Covid-19 Vaccine Bivalent Booster 41yrs & up 05/26/2021   Moderna Sars-Covid-2 Vaccination 08/22/2019, 09/19/2019, 04/21/2020, 10/20/2020   Pneumococcal Conjugate-13 01/01/2014   Pneumococcal Polysaccharide-23 02/11/2011, 02/17/2018   Tdap 02/11/2011   Zoster, Live 01/15/2014    TDAP status: Due, Education has been provided regarding the importance of this vaccine. Advised may receive this vaccine at local pharmacy or Health Dept. Aware to provide a copy of the vaccination record if obtained from local pharmacy or Health Dept. Verbalized acceptance and understanding.  Flu Vaccine status: Due, Education has been provided regarding the importance of this vaccine. Advised may receive this vaccine at local pharmacy or Health Dept. Aware to provide a copy of the vaccination record if  obtained from local pharmacy or Health Dept. Verbalized acceptance and understanding.  Pneumococcal vaccine status: Up to date  Covid-19 vaccine status: Completed vaccines  Qualifies for Shingles Vaccine? Yes   Zostavax completed Yes    Shingrix Completed?: No.    Education has been provided regarding the importance of this vaccine. Patient has been advised to call insurance company to determine out of pocket expense if they have not yet received this vaccine. Advised may also receive vaccine at local pharmacy or Health Dept. Verbalized acceptance and understanding.  Screening Tests Health Maintenance  Topic Date Due   Zoster Vaccines- Shingrix (1 of 2) Never done   TETANUS/TDAP  02/10/2021   COVID-19 Vaccine (6 - Moderna series) 09/23/2021   INFLUENZA VACCINE  02/16/2022   MAMMOGRAM  06/02/2023   COLONOSCOPY (Pts 45-3yrs Insurance coverage will need to be confirmed)  04/24/2024   Pneumonia Vaccine 19+ Years old  Completed   DEXA SCAN  Completed   Hepatitis C Screening  Completed   HPV VACCINES  Aged Out    Health Maintenance  Health Maintenance Due  Topic Date Due   Zoster Vaccines- Shingrix (1 of 2) Never done   TETANUS/TDAP  02/10/2021   COVID-19 Vaccine (6 - Moderna series) 09/23/2021   INFLUENZA VACCINE  02/16/2022    Colorectal cancer screening: Type of screening: Colonoscopy. Completed 04/24/2014. Repeat every 10 years  Mammogram status: Completed 06/01/2021. Repeat every year  Bone Density status: Ordered 04/02/2022. Pt provided with contact info and advised to call to schedule appt.  Lung Cancer Screening: (Low Dose CT Chest recommended if Age 80-80 years, 30 pack-year currently smoking OR have quit w/in 15years.) does not qualify.   Lung Cancer Screening Referral: no  Additional Screening:  Hepatitis C Screening: does qualify; Completed 08/14/2015  Vision Screening: Recommended annual ophthalmology exams for early detection of glaucoma and other disorders of the eye. Is the patient up to date with their annual eye exam?  Yes  Who is the provider or what is the name of the office in which the patient attends annual eye exams? Fonnie Mu, OD. If pt is not established with a provider, would  they like to be referred to a provider to establish care? No .   Dental Screening: Recommended annual dental exams for proper oral hygiene  Community Resource Referral / Chronic Care Management: CRR required this visit?  No   CCM required this visit?  No      Plan:     I have personally reviewed and noted the following in the patient's chart:   Medical and social history Use of alcohol, tobacco or illicit drugs  Current medications and supplements including opioid prescriptions. Patient is not currently taking opioid prescriptions. Functional ability and status Nutritional status Physical activity Advanced directives List of other physicians Hospitalizations, surgeries, and ER visits in previous 12 months Vitals Screenings to include cognitive, depression, and falls Referrals and appointments  In addition, I have reviewed and discussed with patient certain preventive protocols, quality metrics, and best practice recommendations. A written personalized care plan for preventive services as well as general preventive health recommendations were provided to patient.     Sheral Flow, LPN   [phone]   Nurse Notes:

## 2022-04-02 NOTE — Telephone Encounter (Signed)
CRITICAL VALUE STICKER  CRITICAL VALUE: Vitamin D 106  RECEIVER (on-site recipient of call): Dahlia Client  DATE & TIME NOTIFIED: 04/02/22 at 4:38pm   MESSENGER (representative from lab): Hope  MD NOTIFIED: Dr. Jonny Ruiz  TIME OF NOTIFICATION: 4:39pm

## 2022-04-04 ENCOUNTER — Encounter: Payer: Self-pay | Admitting: Internal Medicine

## 2022-04-04 DIAGNOSIS — R32 Unspecified urinary incontinence: Secondary | ICD-10-CM | POA: Insufficient documentation

## 2022-04-04 DIAGNOSIS — E559 Vitamin D deficiency, unspecified: Secondary | ICD-10-CM | POA: Insufficient documentation

## 2022-04-04 NOTE — Assessment & Plan Note (Signed)
Lab Results  Component Value Date   CREATININE 1.46 (H) 04/02/2022   Stable overall, cont to avoid nephrotoxins

## 2022-04-04 NOTE — Assessment & Plan Note (Signed)
Age and sex appropriate education and counseling updated with regular exercise and diet Referrals for preventative services - none needed Immunizations addressed - declines covid booster, shingrix, tdap, but ok for flu shot today Smoking counseling  - none needed Evidence for depression or other mood disorder - none significant Most recent labs reviewed. I have personally reviewed and have noted: 1) the patient's medical and social history 2) The patient's current medications and supplements 3) The patient's height, weight, and BMI have been recorded in the chart

## 2022-04-04 NOTE — Assessment & Plan Note (Signed)
BP Readings from Last 3 Encounters:  04/02/22 118/60  04/02/22 118/60  03/31/21 (!) 116/56   Stable, pt to continue medical treatment lisinopril 20 mg qd, hct 25 mg qd

## 2022-04-04 NOTE — Assessment & Plan Note (Signed)
Last vitamin D Lab Results  Component Value Date   VD25OH 106.41 [facility]) 04/02/2022   Overcontrolled, ok to reduce oral replacement to mon - wed - fri only

## 2022-04-04 NOTE — Assessment & Plan Note (Signed)
Uncontrolled, recent worsening, no s/s infection, for urology referral

## 2022-04-04 NOTE — Assessment & Plan Note (Signed)
Lab Results  Component Value Date   HGBA1C 5.6 04/02/2022   Stable, pt to continue current medical treatment  - diet, wt control, excercise  

## 2022-04-04 NOTE — Assessment & Plan Note (Signed)
Lab Results  Component Value Date   LDLCALC 46 04/02/2022   Stable, pt to continue current statin lipitor 10 mg qd

## 2022-04-06 ENCOUNTER — Other Ambulatory Visit (INDEPENDENT_AMBULATORY_CARE_PROVIDER_SITE_OTHER): Payer: Medicare PPO

## 2022-04-06 DIAGNOSIS — E7800 Pure hypercholesterolemia, unspecified: Secondary | ICD-10-CM | POA: Diagnosis not present

## 2022-04-06 LAB — URINALYSIS, ROUTINE W REFLEX MICROSCOPIC
Bilirubin Urine: NEGATIVE
Hgb urine dipstick: NEGATIVE
Ketones, ur: NEGATIVE
Leukocytes,Ua: NEGATIVE
Nitrite: NEGATIVE
Specific Gravity, Urine: 1.01 (ref 1.000–1.030)
Total Protein, Urine: NEGATIVE
Urine Glucose: NEGATIVE
Urobilinogen, UA: 0.2 (ref 0.0–1.0)
pH: 7 (ref 5.0–8.0)

## 2022-04-08 DIAGNOSIS — N1832 Chronic kidney disease, stage 3b: Secondary | ICD-10-CM | POA: Diagnosis not present

## 2022-04-15 DIAGNOSIS — D631 Anemia in chronic kidney disease: Secondary | ICD-10-CM | POA: Diagnosis not present

## 2022-04-15 DIAGNOSIS — N2581 Secondary hyperparathyroidism of renal origin: Secondary | ICD-10-CM | POA: Diagnosis not present

## 2022-04-15 DIAGNOSIS — I129 Hypertensive chronic kidney disease with stage 1 through stage 4 chronic kidney disease, or unspecified chronic kidney disease: Secondary | ICD-10-CM | POA: Diagnosis not present

## 2022-04-15 DIAGNOSIS — N1832 Chronic kidney disease, stage 3b: Secondary | ICD-10-CM | POA: Diagnosis not present

## 2022-04-24 ENCOUNTER — Other Ambulatory Visit: Payer: Self-pay | Admitting: Internal Medicine

## 2022-04-24 NOTE — Telephone Encounter (Signed)
Please refill as per office routine med refill policy (all routine meds to be refilled for 3 mo or monthly (per pt preference) up to one year from last visit, then month to month grace period for 3 mo, then further med refills will have to be denied) ? ?

## 2022-05-05 ENCOUNTER — Telehealth: Payer: Self-pay

## 2022-05-05 NOTE — Telephone Encounter (Signed)
MEDICATION: lisinopril (ZESTRIL) 20 MG tablet  PHARMACY: [id number] - [place], [address] - [address] AT [address]  Comments:   **Let patient know to contact pharmacy at the end of the day to make sure medication is ready. **  ** Please notify patient to allow 48-72 hours to process**  **Encourage patient to contact the pharmacy for refills or they can request refills through [facility]**

## 2022-05-05 NOTE — Telephone Encounter (Signed)
error 

## 2022-05-07 MED ORDER — LISINOPRIL 20 MG PO TABS: ORAL_TABLET | ORAL | 2 refills | Status: DC

## 2022-05-07 NOTE — Telephone Encounter (Signed)
New rx sent to patient pharmacy  

## 2022-06-07 DIAGNOSIS — Z1231 Encounter for screening mammogram for malignant neoplasm of breast: Secondary | ICD-10-CM | POA: Diagnosis not present

## 2022-06-07 LAB — HM MAMMOGRAPHY

## 2022-06-30 DIAGNOSIS — M1009 Idiopathic gout, multiple sites: Secondary | ICD-10-CM | POA: Diagnosis not present

## 2022-06-30 DIAGNOSIS — L409 Psoriasis, unspecified: Secondary | ICD-10-CM | POA: Diagnosis not present

## 2022-06-30 DIAGNOSIS — R768 Other specified abnormal immunological findings in serum: Secondary | ICD-10-CM | POA: Diagnosis not present

## 2022-06-30 DIAGNOSIS — Z6836 Body mass index (BMI) 36.0-36.9, adult: Secondary | ICD-10-CM | POA: Diagnosis not present

## 2022-06-30 DIAGNOSIS — M1991 Primary osteoarthritis, unspecified site: Secondary | ICD-10-CM | POA: Diagnosis not present

## 2022-06-30 DIAGNOSIS — E669 Obesity, unspecified: Secondary | ICD-10-CM | POA: Diagnosis not present

## 2022-06-30 DIAGNOSIS — Z79899 Other long term (current) drug therapy: Secondary | ICD-10-CM | POA: Diagnosis not present

## 2022-07-26 DIAGNOSIS — L280 Lichen simplex chronicus: Secondary | ICD-10-CM | POA: Diagnosis not present

## 2022-07-26 DIAGNOSIS — Q809 Congenital ichthyosis, unspecified: Secondary | ICD-10-CM | POA: Diagnosis not present

## 2022-07-26 DIAGNOSIS — I8393 Asymptomatic varicose veins of bilateral lower extremities: Secondary | ICD-10-CM | POA: Diagnosis not present

## 2022-07-26 DIAGNOSIS — L281 Prurigo nodularis: Secondary | ICD-10-CM | POA: Diagnosis not present

## 2022-07-26 DIAGNOSIS — L089 Local infection of the skin and subcutaneous tissue, unspecified: Secondary | ICD-10-CM | POA: Diagnosis not present

## 2022-07-29 ENCOUNTER — Other Ambulatory Visit: Payer: Self-pay | Admitting: Internal Medicine

## 2022-07-29 NOTE — Telephone Encounter (Signed)
Please refill as per office routine med refill policy (all routine meds to be refilled for 3 mo or monthly (per pt preference) up to one year from last visit, then month to month grace period for 3 mo, then further med refills will have to be denied) ? ?

## 2022-09-30 DIAGNOSIS — N1832 Chronic kidney disease, stage 3b: Secondary | ICD-10-CM | POA: Diagnosis not present

## 2022-10-06 DIAGNOSIS — D631 Anemia in chronic kidney disease: Secondary | ICD-10-CM | POA: Diagnosis not present

## 2022-10-06 DIAGNOSIS — I129 Hypertensive chronic kidney disease with stage 1 through stage 4 chronic kidney disease, or unspecified chronic kidney disease: Secondary | ICD-10-CM | POA: Diagnosis not present

## 2022-10-06 DIAGNOSIS — N1832 Chronic kidney disease, stage 3b: Secondary | ICD-10-CM | POA: Diagnosis not present

## 2022-10-06 DIAGNOSIS — N2581 Secondary hyperparathyroidism of renal origin: Secondary | ICD-10-CM | POA: Diagnosis not present

## 2022-12-29 DIAGNOSIS — M1009 Idiopathic gout, multiple sites: Secondary | ICD-10-CM | POA: Diagnosis not present

## 2022-12-29 DIAGNOSIS — R768 Other specified abnormal immunological findings in serum: Secondary | ICD-10-CM | POA: Diagnosis not present

## 2022-12-29 DIAGNOSIS — M1991 Primary osteoarthritis, unspecified site: Secondary | ICD-10-CM | POA: Diagnosis not present

## 2022-12-29 DIAGNOSIS — Z6836 Body mass index (BMI) 36.0-36.9, adult: Secondary | ICD-10-CM | POA: Diagnosis not present

## 2022-12-29 DIAGNOSIS — E669 Obesity, unspecified: Secondary | ICD-10-CM | POA: Diagnosis not present

## 2022-12-29 DIAGNOSIS — L409 Psoriasis, unspecified: Secondary | ICD-10-CM | POA: Diagnosis not present

## 2022-12-29 DIAGNOSIS — Z79899 Other long term (current) drug therapy: Secondary | ICD-10-CM | POA: Diagnosis not present

## 2023-02-02 ENCOUNTER — Telehealth: Payer: Self-pay | Admitting: Internal Medicine

## 2023-02-02 ENCOUNTER — Other Ambulatory Visit: Payer: Self-pay

## 2023-02-02 MED ORDER — LISINOPRIL 20 MG PO TABS: ORAL_TABLET | ORAL | 2 refills | Status: DC

## 2023-02-02 NOTE — Telephone Encounter (Signed)
 Medication request send

## 2023-02-02 NOTE — Telephone Encounter (Signed)
Next OV is 04/04/2023.   Prescription Request  02/02/2023  LOV: 04/02/2022  What is the name of the medication or equipment? lisinopril (ZESTRIL) 20 MG tablet   Have you contacted your pharmacy to request a refill? Yes   Which pharmacy would you like this sent to?  [id number] - [place], [place] - [address] AT [facility] [address] [place] [address] Phone: [phone] Fax: [phone]    Patient notified that their request is being sent to the clinical staff for review and that they should receive a response within 2 business days.   Please advise at [facility] [phone]

## 2023-03-29 ENCOUNTER — Ambulatory Visit (INDEPENDENT_AMBULATORY_CARE_PROVIDER_SITE_OTHER)
Admission: RE | Admit: 2023-03-29 | Discharge: 2023-03-29 | Disposition: A | Discharge status: Home / Self Care | Payer: Medicare PPO | Source: Ambulatory Visit | Attending: Internal Medicine | Admitting: Internal Medicine

## 2023-03-29 DIAGNOSIS — Z13820 Encounter for screening for osteoporosis: Secondary | ICD-10-CM | POA: Diagnosis not present

## 2023-03-30 ENCOUNTER — Encounter: Payer: Self-pay | Admitting: Internal Medicine

## 2023-04-04 ENCOUNTER — Encounter: Payer: Self-pay | Admitting: Internal Medicine

## 2023-04-04 ENCOUNTER — Ambulatory Visit (INDEPENDENT_AMBULATORY_CARE_PROVIDER_SITE_OTHER): Payer: Medicare PPO | Admitting: Internal Medicine

## 2023-04-04 VITALS — BP 126/82 | HR 95 | Temp 98.5°F | Ht 68.0 in | Wt 241.0 lb

## 2023-04-04 DIAGNOSIS — R739 Hyperglycemia, unspecified: Secondary | ICD-10-CM | POA: Diagnosis not present

## 2023-04-04 DIAGNOSIS — E559 Vitamin D deficiency, unspecified: Secondary | ICD-10-CM

## 2023-04-04 DIAGNOSIS — E538 Deficiency of other specified B group vitamins: Secondary | ICD-10-CM

## 2023-04-04 DIAGNOSIS — N1831 Chronic kidney disease, stage 3a: Secondary | ICD-10-CM | POA: Diagnosis not present

## 2023-04-04 DIAGNOSIS — I10 Essential (primary) hypertension: Secondary | ICD-10-CM

## 2023-04-04 DIAGNOSIS — D649 Anemia, unspecified: Secondary | ICD-10-CM | POA: Diagnosis not present

## 2023-04-04 DIAGNOSIS — E7800 Pure hypercholesterolemia, unspecified: Secondary | ICD-10-CM | POA: Diagnosis not present

## 2023-04-04 DIAGNOSIS — Z0000 Encounter for general adult medical examination without abnormal findings: Secondary | ICD-10-CM | POA: Diagnosis not present

## 2023-04-04 DIAGNOSIS — Z0001 Encounter for general adult medical examination with abnormal findings: Secondary | ICD-10-CM

## 2023-04-04 DIAGNOSIS — M1712 Unilateral primary osteoarthritis, left knee: Secondary | ICD-10-CM | POA: Diagnosis not present

## 2023-04-04 DIAGNOSIS — R269 Unspecified abnormalities of gait and mobility: Secondary | ICD-10-CM

## 2023-04-04 DIAGNOSIS — Z23 Encounter for immunization: Secondary | ICD-10-CM

## 2023-04-04 LAB — CBC WITH DIFFERENTIAL/PLATELET
Basophils Absolute: 0.1 10*3/uL (ref 0.0–0.1)
Basophils Relative: 1.4 % (ref 0.0–3.0)
Eosinophils Absolute: 0.2 10*3/uL (ref 0.0–0.7)
Eosinophils Relative: 3.6 % (ref 0.0–5.0)
HCT: 34.9 % — ABNORMAL LOW (ref 36.0–46.0)
Hemoglobin: 12 g/dL (ref 12.0–15.0)
Lymphocytes Relative: 22.4 % (ref 12.0–46.0)
Lymphs Abs: 1.5 10*3/uL (ref 0.7–4.0)
MCHC: 34.5 g/dL (ref 30.0–36.0)
MCV: 99.9 fl (ref 78.0–100.0)
Monocytes Absolute: 0.4 10*3/uL (ref 0.1–1.0)
Monocytes Relative: 5.5 % (ref 3.0–12.0)
Neutro Abs: 4.5 10*3/uL (ref 1.4–7.7)
Neutrophils Relative %: 67.1 % (ref 43.0–77.0)
Platelets: 134 10*3/uL — ABNORMAL LOW (ref 150.0–400.0)
RBC: 3.49 Mil/uL — ABNORMAL LOW (ref 3.87–5.11)
RDW: 15.9 % — ABNORMAL HIGH (ref 11.5–15.5)
WBC: 6.8 10*3/uL (ref 4.0–10.5)

## 2023-04-04 LAB — BASIC METABOLIC PANEL
BUN: 27 mg/dL — ABNORMAL HIGH (ref 6–23)
CO2: 26 meq/L (ref 19–32)
Calcium: 9.9 mg/dL (ref 8.4–10.5)
Chloride: 102 meq/L (ref 96–112)
Creatinine, Ser: 1.38 mg/dL — ABNORMAL HIGH (ref 0.40–1.20)
GFR: 37.86 mL/min — ABNORMAL LOW (ref >60.00)
Glucose, Bld: 103 mg/dL — ABNORMAL HIGH (ref 70–99)
Potassium: 4.3 meq/L (ref 3.5–5.1)
Sodium: 137 meq/L (ref 135–145)

## 2023-04-04 LAB — VITAMIN D 25 HYDROXY (VIT D DEFICIENCY, FRACTURES): VITD: 96.04 ng/mL (ref 30.00–100.00)

## 2023-04-04 LAB — HEPATIC FUNCTION PANEL
ALT: 17 U/L (ref 0–35)
AST: 22 U/L (ref 0–37)
Albumin: 4.3 g/dL (ref 3.5–5.2)
Alkaline Phosphatase: 66 U/L (ref 39–117)
Bilirubin, Direct: 0.1 mg/dL (ref 0.0–0.3)
Total Bilirubin: 1 mg/dL (ref 0.2–1.2)
Total Protein: 7 g/dL (ref 6.0–8.3)

## 2023-04-04 LAB — LIPID PANEL
Cholesterol: 141 mg/dL (ref 0–200)
HDL: 62.7 mg/dL (ref >39.00)
LDL Cholesterol: 50 mg/dL (ref 0–99)
NonHDL: 78.67
Total CHOL/HDL Ratio: 2
Triglycerides: 143 mg/dL (ref 0.0–149.0)
VLDL: 28.6 mg/dL (ref 0.0–40.0)

## 2023-04-04 LAB — MICROALBUMIN / CREATININE URINE RATIO
Creatinine,U: 154.2 mg/dL
Microalb Creat Ratio: 0.8 mg/g (ref 0.0–30.0)
Microalb, Ur: 1.2 mg/dL (ref 0.0–1.9)

## 2023-04-04 LAB — IBC PANEL
Iron: 108 ug/dL (ref 42–145)
Saturation Ratios: 32.8 % (ref 20.0–50.0)
TIBC: 329 ug/dL (ref 250.0–450.0)
Transferrin: 235 mg/dL (ref 212.0–360.0)

## 2023-04-04 LAB — FERRITIN: Ferritin: 132.8 ng/mL (ref 10.0–291.0)

## 2023-04-04 LAB — HEMOGLOBIN A1C: Hgb A1c MFr Bld: 5.8 % (ref 4.6–6.5)

## 2023-04-04 LAB — VITAMIN B12: Vitamin B-12: 302 pg/mL (ref 211–911)

## 2023-04-04 LAB — TSH: TSH: 2.5 u[IU]/mL (ref 0.35–5.50)

## 2023-04-04 MED ORDER — HYDROCHLOROTHIAZIDE 25 MG PO TABS: ORAL_TABLET | ORAL | 3 refills | Status: DC

## 2023-04-04 MED ORDER — LISINOPRIL 20 MG PO TABS: ORAL_TABLET | ORAL | 3 refills | Status: DC

## 2023-04-04 MED ORDER — ATORVASTATIN CALCIUM 10 MG PO TABS: 10.0000 mg | ORAL_TABLET | Freq: Every day | ORAL | 3 refills | Status: DC

## 2023-04-04 MED ORDER — BUDESONIDE-FORMOTEROL FUMARATE 160-4.5 MCG/ACT IN AERO: 2.0000 | INHALATION_SPRAY | Freq: Two times a day (BID) | RESPIRATORY_TRACT | 3 refills | Status: DC

## 2023-04-04 MED ORDER — ALBUTEROL SULFATE HFA 108 (90 BASE) MCG/ACT IN AERS: 2.0000 | INHALATION_SPRAY | Freq: Four times a day (QID) | RESPIRATORY_TRACT | 5 refills | Status: DC | PRN

## 2023-04-04 NOTE — Patient Instructions (Addendum)
Please have your Shingrix (shingles) shots done at your local pharmacy, and the Tdap tetanus shot as well  You had the flu shot today  You are given the prescription for the Lift Chair  Please start B12 1000 mcg per day for 6 months  Ok to continue the Vit D3 at 2000 units on Mon- Wed- Fri  Please continue all other medications as before, and refills have been done if requested.  Please have the pharmacy call with any other refills you may need.  Please continue your efforts at being more active, low cholesterol diet, and weight control.  You are otherwise up to date with prevention measures today.  Please keep your appointments with your specialists as you may have planned - Renal next week  Please go to the LAB at the blood drawing area for the tests to be done  You will be contacted by phone if any changes need to be made immediately.  Otherwise, you will receive a letter about your results with an explanation, but please check with MyChart first.  Please remember to sign up for MyChart if you have not done so, as this will be important to you in the future with finding out test results, communicating by private email, and scheduling acute appointments online when needed.  Please make an Appointment to return for your 1 year visit, or sooner if needed

## 2023-04-04 NOTE — Progress Notes (Unsigned)
Patient ID: [name] [name], female   DOB: [birth year], 72 y.o.   MRN: [id number]         Chief Complaint:: wellness exam and anemia, low b12, low vit d, ckd, chronic left knee pain        HPI:  [name] [name] is a 72 y.o. female here for wellness exam; due for flu shot; for shingrix and tdap at pharmacy, delcines covid booster, o/w up to date                        Also Pt denies chest pain, increased sob or doe, wheezing, orthopnea, PND, increased LE swelling, palpitations, dizziness or syncope.   Pt denies polydipsia, polyuria, or new focal neuro s/s.    Pt denies fever, wt loss, night sweats, loss of appetite, or other constitutional symptoms  No recent overt bleeding bruising.  Has chronic left knee arthritic pain, asks for lazy boy lift chair.    Wt Readings from Last 3 Encounters:  04/04/23 241 lb (109.3 kg)  04/02/22 239 lb (108.4 kg)  04/02/22 239 lb 9.6 oz (108.7 kg)   BP Readings from Last 3 Encounters:  04/04/23 126/82  04/02/22 118/60  04/02/22 118/60   Immunization History  Administered Date(s) Administered   Fluad Quad(high Dose 65+) 03/23/2019, 03/31/2021, 05/07/2021, 04/02/2022   Fluad Trivalent(High Dose 65+) 04/04/2023   Influenza, High Dose Seasonal PF 05/24/2017, 05/19/2018   Influenza, Seasonal, Injecte, Preservative Fre 07/18/2012   Influenza,inj,Quad PF,6+ Mos 08/23/2013, 08/14/2015   Moderna Covid-19 Vaccine Bivalent Booster 86yrs & up 05/26/2021   Moderna Sars-Covid-2 Vaccination 08/22/2019, 09/19/2019, 04/21/2020, 10/20/2020   Pneumococcal Conjugate-13 01/01/2014   Pneumococcal Polysaccharide-23 02/11/2011, 02/17/2018   Tdap 02/11/2011   Zoster, Live 01/15/2014   Health Maintenance Due  Topic Date Due   Zoster Vaccines- Shingrix (1 of 2) 04/21/1968   DTaP/Tdap/Td (2 - Td or Tdap) 02/10/2021   COVID-19 Vaccine (6 - 2023-24 season) 03/20/2023   Medicare Annual Wellness (AWV)  04/03/2023      Past Medical History:  Diagnosis Date   Arthritis     knee   Eczema 10/16/2014   Hypertension    Obesity    Past Surgical History:  Procedure Laterality Date   ANAL FISSURE REPAIR  1998   ARTHROSCOPIC REPAIR ACL Right 2010   DIAGNOSTIC MAMMOGRAM  2013   MYOMECTOMY  1985   TOTAL ABDOMINAL HYSTERECTOMY  1995    reports that she has never smoked. She has never used smokeless tobacco. She reports that she does not drink alcohol and does not use drugs. family history includes COPD in her father; Coronary artery disease in her mother; Heart disease in her maternal grandfather; Hyperlipidemia in her mother; Hypertension in her maternal grandmother and mother; Lupus in her mother; Stroke in her maternal grandmother, paternal grandfather, and paternal grandmother. Allergies  Allergen Reactions   Indomethacin Other (See Comments)    The reaction is more of an intolerance. Patient states that she has nausea, dizziness and agitation.    Current Outpatient Medications on File Prior to Visit  Medication Sig Dispense Refill   allopurinol (ZYLOPRIM) 300 MG tablet Take 1 tablet by mouth daily.     aspirin 81 MG tablet Take 81 mg by mouth daily.       Cholecalciferol 125 MCG (5000 UT) TABS Take by mouth.     Colchicine 0.6 MG CAPS Take 1 capsule by mouth daily.     PREDNISONE PO Take by mouth  as needed.     No current facility-administered medications on file prior to visit.        ROS:  All others reviewed and negative.  Objective        PE:  BP 126/82 (BP Location: Right Arm, Patient Position: Sitting, Cuff Size: Normal)   Pulse 95   Temp 98.5 F (36.9 C) (Oral)   Ht 5\' 8"  (1.727 m)   Wt 241 lb (109.3 kg)   SpO2 98%   BMI 36.64 kg/m                 Constitutional: Pt appears in NAD               HENT: Head: NCAT.                Right Ear: External ear normal.                 Left Ear: External ear normal.                Eyes: . Pupils are equal, round, and reactive to light. Conjunctivae and EOM are normal               Nose: without d/c  or deformity               Neck: Neck supple. Gross normal ROM               Cardiovascular: Normal rate and regular rhythm.                 Pulmonary/Chest: Effort normal and breath sounds without rales or wheezing.                Abd:  Soft, NT, ND, + BS, no organomegaly               Neurological: Pt is alert. At baseline orientation, motor grossly intact               Skin: Skin is warm. No rashes, no other new lesions, LE edema - none               Psychiatric: Pt behavior is normal without agitation   Micro: none  Cardiac tracings I have personally interpreted today:  none  Pertinent Radiological findings (summarize): none   Lab Results  Component Value Date   WBC 6.8 04/04/2023   HGB 12.0 04/04/2023   HCT 34.9 (L) 04/04/2023   PLT 134.0 (L) 04/04/2023   GLUCOSE 103 (H) 04/04/2023   CHOL 141 04/04/2023   TRIG 143.0 04/04/2023   HDL 62.70 04/04/2023   LDLDIRECT 84.0 02/17/2018   LDLCALC 50 04/04/2023   ALT 17 04/04/2023   AST 22 04/04/2023   NA 137 04/04/2023   K 4.3 04/04/2023   CL 102 04/04/2023   CREATININE 1.38 (H) 04/04/2023   BUN 27 (H) 04/04/2023   CO2 26 04/04/2023   TSH 2.50 04/04/2023   HGBA1C 5.8 04/04/2023   MICROALBUR 1.2 04/04/2023   Assessment/Plan:  [name] [name] is a 72 y.o. White or Caucasian [1] female with  has a past medical history of Arthritis, Eczema (10/16/2014), Hypertension, and Obesity.  Encounter for well adult exam with abnormal findings Age and sex appropriate education and counseling updated with regular exercise and diet Referrals for preventative services - none needed Immunizations addressed - for shingrix and tdap at pharmacy, declines covid booster Smoking counseling  - none needed Evidence for depression or other mood  disorder - none significant Most recent labs reviewed. I have personally reviewed and have noted: 1) the patient's medical and social history 2) The patient's current medications and supplements 3) The  patient's height, weight, and BMI have been recorded in the chart   CKD (chronic kidney disease) stage 3, GFR 30-59 ml/min (HCC) Lab Results  Component Value Date   CREATININE 1.38 (H) 04/04/2023   Stable overall, cont to avoid nephrotoxins,   Essential hypertension BP Readings from Last 3 Encounters:  04/04/23 126/82  04/02/22 118/60  04/02/22 118/60   Stable, pt to continue medical treatment hct 25 every day, lisniopril 20 qd   HLD (hyperlipidemia) Lab Results  Component Value Date   LDLCALC 50 04/04/2023   Stable, pt to continue current statin lipitor 10 qd   Hyperglycemia Lab Results  Component Value Date   HGBA1C 5.8 04/04/2023   Stable, pt to continue current medical treatment  - diet,wt control   Vitamin D deficiency Last vitamin D Lab Results  Component Value Date   VD25OH 96.04 04/04/2023   Stable, cont oral replacement on Mon - wed - fri 2000 u qd   Arthritis of left knee Chronic persistent, for lazy boy lift chair  Gait disorder Declines PT for now  B12 deficiency Lab Results  Component Value Date   VITAMINB12 302 04/04/2023   Low, to start oral replacement - b12 1000 mcg qd   Anemia Lab Results  Component Value Date   WBC 6.8 04/04/2023   HGB 12.0 04/04/2023   HCT 34.9 (L) 04/04/2023   MCV 99.9 04/04/2023   PLT 134.0 (L) 04/04/2023  Also for iron lab Followup: Return in about 1 year (around 04/03/2024).  Oliver Barre, MD 04/07/2023 5:13 AM [facility]

## 2023-04-05 LAB — PTH, INTACT AND CALCIUM
Calcium: 9.9 mg/dL (ref 8.6–10.4)
PTH: 47 pg/mL (ref 16–77)

## 2023-04-07 ENCOUNTER — Encounter: Payer: Self-pay | Admitting: Internal Medicine

## 2023-04-07 DIAGNOSIS — E538 Deficiency of other specified B group vitamins: Secondary | ICD-10-CM | POA: Insufficient documentation

## 2023-04-07 DIAGNOSIS — D649 Anemia, unspecified: Secondary | ICD-10-CM | POA: Insufficient documentation

## 2023-04-07 NOTE — Assessment & Plan Note (Signed)
Chronic persistent, for lazy boy lift chair

## 2023-04-07 NOTE — Assessment & Plan Note (Signed)
Lab Results  Component Value Date   VITAMINB12 302 04/04/2023   Low, to start oral replacement - b12 1000 mcg qd

## 2023-04-07 NOTE — Assessment & Plan Note (Signed)
Declines PT for now

## 2023-04-07 NOTE — Assessment & Plan Note (Signed)
BP Readings from Last 3 Encounters:  04/04/23 126/82  04/02/22 118/60  04/02/22 118/60   Stable, pt to continue medical treatment hct 25 every day, lisniopril 20 qd

## 2023-04-07 NOTE — Assessment & Plan Note (Signed)
Age and sex appropriate education and counseling updated with regular exercise and diet Referrals for preventative services - none needed Immunizations addressed - for shingrix and tdap at pharmacy, declines covid booster Smoking counseling  - none needed Evidence for depression or other mood disorder - none significant Most recent labs reviewed. I have personally reviewed and have noted: 1) the patient's medical and social history 2) The patient's current medications and supplements 3) The patient's height, weight, and BMI have been recorded in the chart

## 2023-04-07 NOTE — Assessment & Plan Note (Signed)
Lab Results  Component Value Date   HGBA1C 5.8 04/04/2023   Stable, pt to continue current medical treatment  - diet,wt control

## 2023-04-07 NOTE — Assessment & Plan Note (Signed)
Lab Results  Component Value Date   CREATININE 1.38 (H) 04/04/2023   Stable overall, cont to avoid nephrotoxins,

## 2023-04-07 NOTE — Assessment & Plan Note (Signed)
Lab Results  Component Value Date   LDLCALC 50 04/04/2023   Stable, pt to continue current statin lipitor 10 qd

## 2023-04-07 NOTE — Assessment & Plan Note (Addendum)
Last vitamin D Lab Results  Component Value Date   VD25OH 96.04 04/04/2023   Stable, cont oral replacement on Mon - wed - fri 2000 u qd

## 2023-04-07 NOTE — Assessment & Plan Note (Signed)
Lab Results  Component Value Date   WBC 6.8 04/04/2023   HGB 12.0 04/04/2023   HCT 34.9 (L) 04/04/2023   MCV 99.9 04/04/2023   PLT 134.0 (L) 04/04/2023  Also for iron lab

## 2023-04-08 DIAGNOSIS — N1832 Chronic kidney disease, stage 3b: Secondary | ICD-10-CM | POA: Diagnosis not present

## 2023-04-14 DIAGNOSIS — N1832 Chronic kidney disease, stage 3b: Secondary | ICD-10-CM | POA: Diagnosis not present

## 2023-04-14 DIAGNOSIS — N2581 Secondary hyperparathyroidism of renal origin: Secondary | ICD-10-CM | POA: Diagnosis not present

## 2023-04-14 DIAGNOSIS — I129 Hypertensive chronic kidney disease with stage 1 through stage 4 chronic kidney disease, or unspecified chronic kidney disease: Secondary | ICD-10-CM | POA: Diagnosis not present

## 2023-04-14 DIAGNOSIS — D631 Anemia in chronic kidney disease: Secondary | ICD-10-CM | POA: Diagnosis not present

## 2023-04-27 ENCOUNTER — Telehealth: Payer: Self-pay | Admitting: Internal Medicine

## 2023-04-27 NOTE — Telephone Encounter (Signed)
Prescription Request  04/27/2023  LOV: 04/04/2023  What is the name of the medication or equipment? hydrochlorothizide  Have you contacted your pharmacy to request a refill? Yes   Which pharmacy would you like this sent to?  [id number] - [place], [place] - [address] AT [facility] OF [address] [place] [address] Phone: [phone] Fax: [phone]   Patient notified that their request is being sent to the clinical staff for review and that they should receive a response within 2 business days.   Please advise at Mobile [phone] (mobile)

## 2023-04-27 NOTE — Telephone Encounter (Signed)
Was sent in on 04/04/23.

## 2023-04-29 DIAGNOSIS — L281 Prurigo nodularis: Secondary | ICD-10-CM | POA: Diagnosis not present

## 2023-05-06 DIAGNOSIS — N1832 Chronic kidney disease, stage 3b: Secondary | ICD-10-CM | POA: Diagnosis not present

## 2023-05-18 ENCOUNTER — Other Ambulatory Visit: Payer: Self-pay | Admitting: Internal Medicine

## 2023-05-18 ENCOUNTER — Other Ambulatory Visit: Payer: Self-pay

## 2023-06-13 ENCOUNTER — Encounter: Payer: Self-pay | Admitting: Pharmacist

## 2023-06-13 DIAGNOSIS — Z1231 Encounter for screening mammogram for malignant neoplasm of breast: Secondary | ICD-10-CM | POA: Diagnosis not present

## 2023-06-13 NOTE — Progress Notes (Signed)
Pharmacy Quality Measure Review  This patient is appearing on a report for being at risk of failing the adherence measure for cholesterol (statin) medications this calendar year.   Medication: Atorvastatin 10 mg Last fill date: 05/18/2023 for 90 day supply  Insurance report was not up to date. No action needed at this time.  Pt should pass metric.  Arbutus Leas, PharmD, BCPS Clinical Pharmacist [facility] [phone]

## 2023-06-14 ENCOUNTER — Encounter: Payer: Self-pay | Admitting: Internal Medicine

## 2023-06-23 DIAGNOSIS — R922 Inconclusive mammogram: Secondary | ICD-10-CM | POA: Diagnosis not present

## 2023-06-23 DIAGNOSIS — N6042 Mammary duct ectasia of left breast: Secondary | ICD-10-CM | POA: Diagnosis not present

## 2023-06-23 DIAGNOSIS — N6342 Unspecified lump in left breast, subareolar: Secondary | ICD-10-CM | POA: Diagnosis not present

## 2023-06-23 LAB — HM MAMMOGRAPHY

## 2023-07-04 DIAGNOSIS — M25562 Pain in left knee: Secondary | ICD-10-CM | POA: Diagnosis not present

## 2023-07-04 DIAGNOSIS — Z6836 Body mass index (BMI) 36.0-36.9, adult: Secondary | ICD-10-CM | POA: Diagnosis not present

## 2023-07-04 DIAGNOSIS — E669 Obesity, unspecified: Secondary | ICD-10-CM | POA: Diagnosis not present

## 2023-07-04 DIAGNOSIS — L409 Psoriasis, unspecified: Secondary | ICD-10-CM | POA: Diagnosis not present

## 2023-07-04 DIAGNOSIS — R768 Other specified abnormal immunological findings in serum: Secondary | ICD-10-CM | POA: Diagnosis not present

## 2023-07-04 DIAGNOSIS — M25561 Pain in right knee: Secondary | ICD-10-CM | POA: Diagnosis not present

## 2023-07-04 DIAGNOSIS — M1009 Idiopathic gout, multiple sites: Secondary | ICD-10-CM | POA: Diagnosis not present

## 2023-07-04 DIAGNOSIS — Z79899 Other long term (current) drug therapy: Secondary | ICD-10-CM | POA: Diagnosis not present

## 2023-07-04 DIAGNOSIS — M1991 Primary osteoarthritis, unspecified site: Secondary | ICD-10-CM | POA: Diagnosis not present

## 2023-07-05 DIAGNOSIS — N6325 Unspecified lump in the left breast, overlapping quadrants: Secondary | ICD-10-CM | POA: Diagnosis not present

## 2023-07-05 DIAGNOSIS — D242 Benign neoplasm of left breast: Secondary | ICD-10-CM | POA: Diagnosis not present

## 2023-07-07 DIAGNOSIS — N390 Urinary tract infection, site not specified: Secondary | ICD-10-CM | POA: Diagnosis not present

## 2023-07-07 DIAGNOSIS — N1832 Chronic kidney disease, stage 3b: Secondary | ICD-10-CM | POA: Diagnosis not present

## 2023-07-15 DIAGNOSIS — N1832 Chronic kidney disease, stage 3b: Secondary | ICD-10-CM | POA: Diagnosis not present

## 2023-07-15 DIAGNOSIS — N2581 Secondary hyperparathyroidism of renal origin: Secondary | ICD-10-CM | POA: Diagnosis not present

## 2023-07-15 DIAGNOSIS — D631 Anemia in chronic kidney disease: Secondary | ICD-10-CM | POA: Diagnosis not present

## 2023-07-15 DIAGNOSIS — I129 Hypertensive chronic kidney disease with stage 1 through stage 4 chronic kidney disease, or unspecified chronic kidney disease: Secondary | ICD-10-CM | POA: Diagnosis not present

## 2023-07-15 DIAGNOSIS — N189 Chronic kidney disease, unspecified: Secondary | ICD-10-CM | POA: Diagnosis not present

## 2023-11-03 ENCOUNTER — Other Ambulatory Visit: Payer: Self-pay | Admitting: Internal Medicine

## 2023-11-03 NOTE — Telephone Encounter (Signed)
 Copied from CRM [id number]. Topic: Clinical - Medication Refill >> Nov 03, 2023  9:10 AM Howard Macho wrote: Most Recent Primary Care Visit:  Provider: Roslyn Coombe  Department: [facility]  Visit Type: PHYSICAL  Date: 04/04/2023  Medication: lisinopril (ZESTRIL) 20 MG tablet (patient stated it is something with the provider numbers and that is not hooking up with the insurance and the pharmacy wanted to know If he is retired)  Has the patient contacted their pharmacy? Yes (Agent: If no, request that the patient contact the pharmacy for the refill. If patient does not wish to contact the pharmacy document the reason why and proceed with request.) (Agent: If yes, when and what did the pharmacy advise?)  Is this the correct pharmacy for this prescription? Yes If no, delete pharmacy and type the correct one.  This is the patient's preferred pharmacy:  [id number] - [place], [place] - [address] AT [facility] OF [address] [place]  [address] Phone: [phone] Fax: [phone]  Has the prescription been filled recently? No  Is the patient out of the medication? Yes  Has the patient been seen for an appointment in the last year OR does the patient have an upcoming appointment? Yes  Can we respond through MyChart? No  Agent: Please be advised that Rx refills may take up to 3 business days. We ask that you follow-up with your pharmacy.

## 2023-11-07 MED ORDER — LISINOPRIL 20 MG PO TABS: ORAL_TABLET | ORAL | 3 refills | Status: DC

## 2023-11-16 ENCOUNTER — Ambulatory Visit

## 2023-11-16 VITALS — Ht 68.0 in | Wt 241.0 lb

## 2023-11-16 DIAGNOSIS — Z0000 Encounter for general adult medical examination without abnormal findings: Secondary | ICD-10-CM | POA: Diagnosis not present

## 2023-11-16 NOTE — Progress Notes (Signed)
 Subjective:   [name] [name] is a 73 y.o. who presents for a Medicare Wellness preventive visit.  Visit Complete: Virtual I connected with  Jay Meth Munns on 11/16/23 by a audio enabled telemedicine application and verified that I am speaking with the correct person using two identifiers.  Patient Location: Home  Provider Location: Home Office  I discussed the limitations of evaluation and management by telemedicine. The patient expressed understanding and agreed to proceed.  Vital Signs: Because this visit was a virtual/telehealth visit, some criteria may be missing or patient reported. Any vitals not documented were not able to be obtained and vitals that have been documented are patient reported.  VideoDeclined- This patient declined Librarian, academic. Therefore the visit was completed with audio only.  Persons Participating in Visit: Patient.  AWV Questionnaire: No: Patient Medicare AWV questionnaire was not completed prior to this visit.  Cardiac Risk Factors include: advanced age (>28men, >6 women);hypertension;Other (see comment);dyslipidemia, Risk factor comments: CKD, Asthma     Objective:    Today's Vitals   11/16/23 1526  Weight: 241 lb (109.3 kg)  Height: 5\' 8"  (1.727 m)   Body mass index is 36.64 kg/m.     11/16/2023    3:49 PM 04/02/2022    2:06 PM 04/10/2014   10:45 AM  Advanced Directives  Does Patient Have a Medical Advance Directive? Yes Yes Yes  Type of Estate agent of [place];Living will Healthcare Power of [place];Living will Living will;Healthcare Power of Attorney  Copy of Healthcare Power of Attorney in Chart? No - copy requested No - copy requested     Current Medications (verified) Outpatient Encounter Medications as of 11/16/2023  Medication Sig   albuterol  (VENTOLIN  HFA) 108 (90 Base) MCG/ACT inhaler Inhale 2 puffs into the lungs every 6 (six) hours as needed for wheezing or shortness  of breath.   allopurinol (ZYLOPRIM) 300 MG tablet Take 1 tablet by mouth daily.   aspirin 81 MG tablet Take 81 mg by mouth daily.     atorvastatin  (LIPITOR) 10 MG tablet TAKE 1 TABLET EVERY DAY   budesonide -formoterol  (SYMBICORT ) 160-4.5 MCG/ACT inhaler Inhale 2 puffs into the lungs 2 (two) times daily.   Cholecalciferol 125 MCG (5000 UT) TABS Take by mouth.   Colchicine 0.6 MG CAPS Take 1 capsule by mouth daily.   hydrochlorothiazide  (HYDRODIURIL ) 25 MG tablet TAKE 1 TABLET(25 MG) BY MOUTH DAILY   lisinopril  (ZESTRIL ) 20 MG tablet TAKE 1 TABLET(20 MG) BY MOUTH DAILY   PREDNISONE  PO Take by mouth as needed.   No facility-administered encounter medications on file as of 11/16/2023.    Allergies (verified) Indomethacin    History: Past Medical History:  Diagnosis Date   Arthritis    knee   Eczema 10/16/2014   Hypertension    Obesity    Past Surgical History:  Procedure Laterality Date   ANAL FISSURE REPAIR  1998   ARTHROSCOPIC REPAIR ACL Right 2010   DIAGNOSTIC MAMMOGRAM  2013   MYOMECTOMY  1985   TOTAL ABDOMINAL HYSTERECTOMY  1995   Family History  Problem Relation Age of Onset   Coronary artery disease Mother        CABG   Hypertension Mother    Hyperlipidemia Mother    Lupus Mother    COPD Father    Hypertension Maternal Grandmother    Stroke Maternal Grandmother    Heart disease Maternal Grandfather    Stroke Paternal Grandmother    Stroke Paternal Grandfather  Cancer Neg Hx        breast or colon   Colon cancer Neg Hx    Social History   Socioeconomic History   Marital status: Single    Spouse name: Not on file   Number of children: Not on file   Years of education: Not on file   Highest education level: Not on file  Occupational History   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 Single, has own home. 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 End of Life care: yes for CPR and short term mechanical ventilation but no long term artificial support.       Lives alone/2025   Social Drivers of Health   Financial Resource Strain: Low Risk  (11/16/2023)   Overall Financial Resource Strain (CARDIA)    Difficulty of Paying Living Expenses: Not hard at all  Food Insecurity: No Food Insecurity (11/16/2023)   Hunger Vital Sign    Worried About Running Out of Food in the Last Year: Never true    Ran Out of Food in the Last Year: Never true  Transportation Needs: No Transportation Needs (11/16/2023)   PRAPARE - Administrator, Civil Service (Medical): No    Lack of Transportation (Non-Medical): No  Physical Activity: Insufficiently Active (04/02/2022)   Exercise Vital Sign    Days of Exercise per Week: 5 days    Minutes of Exercise per Session: 20 min  Stress: No Stress Concern Present (11/16/2023)   [employer] of Occupational Health - Occupational Stress Questionnaire    Feeling of Stress : Only a little  Social Connections: Socially Isolated (11/16/2023)   Social Connection and Isolation Panel [NHANES]    Frequency of Communication with Friends and Family: More than three times a week    Frequency of Social Gatherings with Friends and Family: Twice a week    Attends Religious Services: Never    Database administrator or Organizations: No    Attends Engineer, structural: Never    Marital Status: Never married    Tobacco Counseling Counseling given: Not Answered    Clinical Intake:  Pre-visit preparation completed: Yes  Pain : No/denies pain     BMI - recorded: 36.64 Nutritional Status: BMI > 30  Obese Nutritional Risks: None Diabetes: No  Lab Results  Component Value Date   HGBA1C 5.8  04/04/2023   HGBA1C 5.6 04/02/2022   HGBA1C 5.0 03/31/2021     How often do you need to have someone help you when you read instructions, pamphlets, or other written materials from your doctor or pharmacy?: 1 - Never  Interpreter Needed?: No  Information entered by :: Rejoice Heatwole, RMA   Activities of Daily Living     11/16/2023    3:27 PM  In your present state of health, do you have any difficulty performing the following activities:  Hearing? 0  Vision? 0  Difficulty concentrating or making decisions? 0  Walking or climbing stairs? 0  Dressing or bathing? 0  Doing errands, shopping? 0  Preparing Food and eating ? N  Using the Toilet?  N  In the past six months, have you accidently leaked urine? N  Do you have problems with loss of bowel control? N  Managing your Medications? N  Managing your Finances? N  Housekeeping or managing your Housekeeping? N    Patient Care Team: Roslyn Coombe, MD as PCP - General (Internal Medicine) Waylon Hahn, OD as Consulting Physician (Optometry) [facility] Stefan Edge, MD as Consulting Physician (Rheumatology) Melodie Spry, MD as Consulting Physician (Nephrology)  Indicate any recent Medical Services you may have received from other than [facility] providers in the past year (date may be approximate).     Assessment:   This is a routine wellness examination for [employer].  Hearing/Vision screen Hearing Screening - Comments:: Denies hearing difficulties   Vision Screening - Comments:: Wears eyeglasses for reading   Goals Addressed             This Visit's Progress    My goal is to prevent from falling by being very careful.   On track      Depression Screen     11/16/2023    3:53 PM 04/04/2023    1:14 PM 04/02/2022    2:32 PM 04/02/2022    1:53 PM 03/31/2021    3:09 PM 03/31/2021    2:39 PM 02/21/2020    3:58 PM  PHQ 2/9 Scores  PHQ - 2 Score 0 0  0 0 0 0  PHQ- 9 Score 0        Exception Documentation    Patient refusal        Fall Risk     11/16/2023    3:50 PM 04/04/2023    1:14 PM 04/02/2022    2:32 PM 04/02/2022    2:07 PM 03/31/2021    3:09 PM  Fall Risk   Falls in the past year? 0 0 0 0 0  Number falls in past yr: 0 0  0 0  Injury with Fall? 0 0 0 0 0  Risk for fall due to :  No Fall Risks No Fall Risks No Fall Risks   Follow up Falls prevention discussed;Falls evaluation completed Falls evaluation completed Falls evaluation completed Falls prevention discussed;Falls evaluation completed     MEDICARE RISK AT HOME:  Medicare Risk at Home Any stairs in or around the home?: No If so, are there any without handrails?: No Home free of loose throw rugs in walkways, pet beds, electrical cords, etc?: Yes Adequate lighting in your home to reduce risk of falls?: Yes Life alert?: No Use of a cane, walker or w/c?: Yes (cane) Grab bars in the bathroom?: Yes Shower chair or bench in shower?: Yes Elevated toilet seat or a handicapped toilet?: Yes  TIMED UP AND GO:  Was the test performed?  No  Cognitive Function: Declined/Normal: No cognitive concerns noted by patient or family. Patient alert, oriented, able to answer questions appropriately and recall recent events. No signs of memory loss or confusion.        04/02/2022    2:08 PM  6CIT Screen  What Year? 0 points  What month? 0 points  What time? 0 points  Count back from 20 0 points  Months in reverse 0 points  Repeat phrase 0 points  Total Score 0 points    Immunizations Immunization History  Administered Date(s) Administered   Fluad Quad(high Dose 65+) 03/23/2019, 03/31/2021, 05/07/2021, 04/02/2022   Fluad Trivalent(High Dose 65+) 04/04/2023   Influenza, High Dose Seasonal PF 05/24/2017,  05/19/2018   Influenza, Seasonal, Injecte, Preservative Fre 07/18/2012   Influenza,inj,Quad PF,6+ Mos 08/23/2013, 08/14/2015   Moderna Covid-19 Vaccine Bivalent Booster 71yrs & up 05/26/2021   Moderna Sars-Covid-2 Vaccination  08/22/2019, 09/19/2019, 04/21/2020, 10/20/2020   Pneumococcal Conjugate-13 01/01/2014   Pneumococcal Polysaccharide-23 02/11/2011, 02/17/2018   Tdap 02/11/2011   Zoster, Live 01/15/2014    Screening Tests Health Maintenance  Topic Date Due   Zoster Vaccines- Shingrix (1 of 2) 04/21/1968   DTaP/Tdap/Td (2 - Td or Tdap) 02/10/2021   COVID-19 Vaccine (6 - 2024-25 season) 03/20/2023   INFLUENZA VACCINE  02/17/2024   Colonoscopy  04/24/2024   MAMMOGRAM  06/14/2024   Medicare Annual Wellness (AWV)  11/15/2024   Pneumonia Vaccine 63+ Years old  Completed   DEXA SCAN  Completed   Hepatitis C Screening  Completed   HPV VACCINES  Aged Out   Meningococcal B Vaccine  Aged Out    Health Maintenance  Health Maintenance Due  Topic Date Due   Zoster Vaccines- Shingrix (1 of 2) 04/21/1968   DTaP/Tdap/Td (2 - Td or Tdap) 02/10/2021   COVID-19 Vaccine (6 - 2024-25 season) 03/20/2023   Health Maintenance Items Addressed: See Nurse Notes  Additional Screening:  Vision Screening: Recommended annual ophthalmology exams for early detection of glaucoma and other disorders of the eye.  Dental Screening: Recommended annual dental exams for proper oral hygiene  Community Resource Referral / Chronic Care Management: CRR required this visit?  No   CCM required this visit?  No     Plan:     I have personally reviewed and noted the following in the patient's chart:   Medical and social history Use of alcohol, tobacco or illicit drugs  Current medications and supplements including opioid prescriptions. Patient is not currently taking opioid prescriptions. Functional ability and status Nutritional status Physical activity Advanced directives List of other physicians Hospitalizations, surgeries, and ER visits in previous 12 months Vitals Screenings to include cognitive, depression, and falls Referrals and appointments  In addition, I have reviewed and discussed with patient certain  preventive protocols, quality metrics, and best practice recommendations. A written personalized care plan for preventive services as well as general preventive health recommendations were provided to patient.     Kern Gingras L Raniya Golembeski, CMA   11/16/2023   After Visit Summary: (MyChart) Due to this being a telephonic visit, the after visit summary with patients personalized plan was offered to patient via MyChart   Notes: Please refer to Routing Comments.

## 2023-11-16 NOTE — Patient Instructions (Addendum)
 [name] [name] , Thank you for taking time to come for your Medicare Wellness Visit. I appreciate your ongoing commitment to your health goals. Please review the following plan we discussed and let me know if I can assist you in the future.   Referrals/Orders/Follow-Ups/Clinician Recommendations: It was nice talking with you today.  You are due for a tetanus and a Shingles vaccine if you decide to get it.  Each day, aim for 6 glasses of water, plenty of protein in your diet and try to get up and walk/ stretch every hour for 5-10 minutes at a time.    This is a list of the screening recommended for you and due dates:  Health Maintenance  Topic Date Due   Zoster (Shingles) Vaccine (1 of 2) 04/21/1968   DTaP/Tdap/Td vaccine (2 - Td or Tdap) 02/10/2021   COVID-19 Vaccine (6 - 2024-25 season) 03/20/2023   Flu Shot  02/17/2024   Colon Cancer Screening  04/24/2024   Mammogram  06/14/2024   Medicare Annual Wellness Visit  11/15/2024   Pneumonia Vaccine  Completed   DEXA scan (bone density measurement)  Completed   Hepatitis C Screening  Completed   HPV Vaccine  Aged Out   Meningitis B Vaccine  Aged Out    Advanced directives: (Copy Requested) Please bring a copy of your health care power of attorney and living will to the office to be added to your chart at your convenience. You can mail to [facility] [address] [place], [place] [address] or email to [email or web address]  Next Medicare Annual Wellness Visit scheduled for next year: Yes

## 2023-11-28 ENCOUNTER — Other Ambulatory Visit (INDEPENDENT_AMBULATORY_CARE_PROVIDER_SITE_OTHER): Payer: Self-pay

## 2023-11-28 ENCOUNTER — Ambulatory Visit: Admitting: Orthopedic Surgery

## 2023-11-28 ENCOUNTER — Telehealth: Payer: Self-pay

## 2023-11-28 DIAGNOSIS — M170 Bilateral primary osteoarthritis of knee: Secondary | ICD-10-CM

## 2023-11-28 DIAGNOSIS — M1712 Unilateral primary osteoarthritis, left knee: Secondary | ICD-10-CM | POA: Diagnosis not present

## 2023-11-28 DIAGNOSIS — M25562 Pain in left knee: Secondary | ICD-10-CM

## 2023-11-28 DIAGNOSIS — M25561 Pain in right knee: Secondary | ICD-10-CM | POA: Diagnosis not present

## 2023-11-28 NOTE — Telephone Encounter (Signed)
Auth needed for left knee gel 

## 2023-11-29 ENCOUNTER — Encounter: Payer: Self-pay | Admitting: Orthopedic Surgery

## 2023-11-29 MED ORDER — BUPIVACAINE HCL 0.25 % IJ SOLN
4.0000 mL | INTRAMUSCULAR | Status: AC | PRN
  Administered 2023-11-28: 4 mL via INTRA_ARTICULAR

## 2023-11-29 MED ORDER — LIDOCAINE HCL 1 % IJ SOLN
5.0000 mL | INTRAMUSCULAR | Status: AC | PRN
  Administered 2023-11-28: 5 mL

## 2023-11-29 NOTE — Progress Notes (Signed)
 Office Visit Note   Patient: [name] [name]           Date of Birth: [birth year]           MRN: [id number] Visit Date: 11/28/2023 Requested by: Roslyn Coombe, MD [address] [place],  [place] [address] PCP: Roslyn Coombe, MD  Subjective: Chief Complaint  Patient presents with   Right Knee - Pain   Left Knee - Pain    HPI: [name] [name] is a 72 y.o. female who presents to the office reporting bilateral knee pain left worse than right.  Hurts some below the left knee as well.  She describes having a torn meniscus on the right knee several years ago which underwent arthroscopic debridement.  She did well with that.  Currently she is unable to do stairs unless she holds onto something.  Has to go 1 step at a time.  Thinks that she may also have some neuropathy in both of her feet.  She went off naproxen due to kidney concerns.  She does take Tylenol for symptoms.  She feels like there is "something moving" in that left knee.  Has not had injections in either knee.  Does okay at rest with no rest pain..                ROS: All systems reviewed are negative as they relate to the chief complaint within the history of present illness.  Patient denies fevers or chills.  Assessment & Plan: Visit Diagnoses:  1. Pain in both knees, unspecified chronicity     Plan: Impression is bilateral knee pain with arthritis.  Right knee surprisingly not too symptomatic.  Plan is nonweightbearing quad strengthening exercises.  Left knee is injected today.  We will also preapproved for gel injection which we can do in about 2-2 and half months.  She wants to avoid knee replacement if possible. This patient is diagnosed with osteoarthritis of the knee(s).    Radiographs show evidence of joint space narrowing, osteophytes, subchondral sclerosis and/or subchondral cysts.  This patient has knee pain which interferes with functional and activities of daily living.    This patient has experienced  inadequate response, adverse effects and/or intolerance with conservative treatments such as acetaminophen, NSAIDS, topical creams, physical therapy or regular exercise, knee bracing and/or weight loss.   This patient has experienced inadequate response or has a contraindication to intra articular steroid injections for at least 3 months.   This patient is not scheduled to have a total knee replacement within 6 months of starting treatment with viscosupplementation.   Follow-Up InstructionINo follow-ups on file.   Orders:  Orders Placed This Encounter  Procedures   XR KNEE 3 VIEW RIGHT   XR KNEE 3 VIEW LEFT   No orders of the defined types were placed in this encounter.     Procedures: Large Joint Inj: L knee on 11/28/2023 7:14 PM Indications: diagnostic evaluation, joint swelling and pain Details: 18 G 1.5 in needle, superolateral approach  Arthrogram: No  Medications: 5 mL lidocaine 1 %; 4 mL bupivacaine 0.25 % Outcome: tolerated well, no immediate complications Procedure, treatment alternatives, risks and benefits explained, specific risks discussed. Consent was given by the patient. Immediately prior to procedure a time out was called to verify the correct patient, procedure, equipment, support staff and site/side marked as required. Patient was prepped and draped in the usual sterile fashion.    Kenalog  injected   Clinical Data: No additional  findings.  Objective: Vital Signs: There were no vitals taken for this visit.  Physical Exam:  Constitutional: Patient appears well-developed HEENT:  Head: Normocephalic Eyes:EOM are normal Neck: Normal range of motion Cardiovascular: Normal rate Pulmonary/chest: Effort normal Neurologic: Patient is alert Skin: Skin is warm Psychiatric: Patient has normal mood and affect  Ortho Exam: Ortho exam demonstrates normal gait with slight valgus alignment in the left knee.  Pedal pulses palpable.  Ankle dorsiflexion intact  bilaterally.  No groin pain with internal/external rotation of the leg.  Left knee has about a 3 to 4 degree flexion contracture right knee no flexion contracture.  No effusion in either knee.  Patient has diffuse PERI retinacular tenderness with mild patellofemoral crepitus bilaterally.  Extensor mechanism intact.  Specialty Comments:  No specialty comments available.  Imaging: No results found.   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   HLD (hyperlipidemia) 02/21/2020   Rash 02/20/2019   Hyperglycemia 02/20/2019   Foot joint pain 04/18/2018   Asthma 02/17/2018   CKD (chronic kidney disease) stage 3, GFR 30-59 ml/min (HCC) 02/17/2018   Peripheral neuropathy 02/18/2017   Dysuria 12/11/2015   Gout attack 12/11/2015   Dyspnea 08/14/2015   Anxiety state 08/14/2015   Allergic rhinitis 08/14/2015   Eczema 10/16/2014   Encounter for well adult exam with abnormal findings 02/12/2011   BENIGN POSITIONAL VERTIGO 02/22/2008   OBESITY 06/20/2007   Essential hypertension 06/20/2007   ANAL FISSURE, HX OF 06/20/2007   TAH/BSO, HX OF 06/20/2007   Past Medical History:  Diagnosis Date   Arthritis    knee   Eczema 10/16/2014   Hypertension    Obesity     Family History  Problem Relation Age of Onset   Coronary artery disease Mother        CABG   Hypertension Mother    Hyperlipidemia Mother    Lupus Mother    COPD Father    Hypertension Maternal Grandmother    Stroke Maternal Grandmother    Heart disease Maternal Grandfather    Stroke Paternal Grandmother    Stroke Paternal Grandfather    Cancer Neg Hx        breast or colon   Colon cancer Neg Hx     Past Surgical History:  Procedure Laterality Date   ANAL FISSURE REPAIR  1998    ARTHROSCOPIC REPAIR ACL Right 2010   DIAGNOSTIC MAMMOGRAM  2013   MYOMECTOMY  1985   TOTAL ABDOMINAL HYSTERECTOMY  1995   Social History   Occupational History   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

## 2023-12-02 ENCOUNTER — Telehealth: Payer: Self-pay

## 2023-12-02 NOTE — Telephone Encounter (Signed)
 This patient is appearing on a report for being at risk of failing the adherence measure for hypertension (ACEi/ARB) medications this calendar year.   Medication: lisinopril  20 mg daily Last fill date: 11/03/23 for 90 day supply  Insurance report was not up to date. No action needed at this time.   Abelina Abide, PharmD PGY1 Pharmacy Resident 12/02/2023 10:23 AM

## 2023-12-14 NOTE — Telephone Encounter (Signed)
 VOB submitted for Monovisc, left knee

## 2023-12-22 DIAGNOSIS — N1832 Chronic kidney disease, stage 3b: Secondary | ICD-10-CM | POA: Diagnosis not present

## 2023-12-23 DIAGNOSIS — N390 Urinary tract infection, site not specified: Secondary | ICD-10-CM | POA: Diagnosis not present

## 2023-12-23 DIAGNOSIS — N1832 Chronic kidney disease, stage 3b: Secondary | ICD-10-CM | POA: Diagnosis not present

## 2023-12-29 DIAGNOSIS — I129 Hypertensive chronic kidney disease with stage 1 through stage 4 chronic kidney disease, or unspecified chronic kidney disease: Secondary | ICD-10-CM | POA: Diagnosis not present

## 2023-12-29 DIAGNOSIS — G5793 Unspecified mononeuropathy of bilateral lower limbs: Secondary | ICD-10-CM | POA: Diagnosis not present

## 2023-12-29 DIAGNOSIS — D631 Anemia in chronic kidney disease: Secondary | ICD-10-CM | POA: Diagnosis not present

## 2023-12-29 DIAGNOSIS — N1832 Chronic kidney disease, stage 3b: Secondary | ICD-10-CM | POA: Diagnosis not present

## 2023-12-29 DIAGNOSIS — N2581 Secondary hyperparathyroidism of renal origin: Secondary | ICD-10-CM | POA: Diagnosis not present

## 2024-01-02 DIAGNOSIS — M1009 Idiopathic gout, multiple sites: Secondary | ICD-10-CM | POA: Diagnosis not present

## 2024-01-02 DIAGNOSIS — L409 Psoriasis, unspecified: Secondary | ICD-10-CM | POA: Diagnosis not present

## 2024-01-02 DIAGNOSIS — E669 Obesity, unspecified: Secondary | ICD-10-CM | POA: Diagnosis not present

## 2024-01-02 DIAGNOSIS — Z6836 Body mass index (BMI) 36.0-36.9, adult: Secondary | ICD-10-CM | POA: Diagnosis not present

## 2024-01-02 DIAGNOSIS — R768 Other specified abnormal immunological findings in serum: Secondary | ICD-10-CM | POA: Diagnosis not present

## 2024-01-02 DIAGNOSIS — M1991 Primary osteoarthritis, unspecified site: Secondary | ICD-10-CM | POA: Diagnosis not present

## 2024-01-02 DIAGNOSIS — Z79899 Other long term (current) drug therapy: Secondary | ICD-10-CM | POA: Diagnosis not present

## 2024-01-18 DIAGNOSIS — M1712 Unilateral primary osteoarthritis, left knee: Secondary | ICD-10-CM | POA: Diagnosis not present

## 2024-01-18 DIAGNOSIS — R030 Elevated blood-pressure reading, without diagnosis of hypertension: Secondary | ICD-10-CM | POA: Diagnosis not present

## 2024-01-18 DIAGNOSIS — G5792 Unspecified mononeuropathy of left lower limb: Secondary | ICD-10-CM | POA: Diagnosis not present

## 2024-02-06 ENCOUNTER — Other Ambulatory Visit (INDEPENDENT_AMBULATORY_CARE_PROVIDER_SITE_OTHER): Payer: Self-pay

## 2024-02-06 ENCOUNTER — Encounter: Payer: Self-pay | Admitting: Orthopedic Surgery

## 2024-02-06 ENCOUNTER — Ambulatory Visit: Admitting: Orthopedic Surgery

## 2024-02-06 DIAGNOSIS — M79605 Pain in left leg: Secondary | ICD-10-CM

## 2024-02-06 DIAGNOSIS — M1712 Unilateral primary osteoarthritis, left knee: Secondary | ICD-10-CM

## 2024-02-06 DIAGNOSIS — M170 Bilateral primary osteoarthritis of knee: Secondary | ICD-10-CM | POA: Diagnosis not present

## 2024-02-06 MED ORDER — HYALURONAN 88 MG/4ML IX SOSY
88.0000 mg | PREFILLED_SYRINGE | INTRA_ARTICULAR | Status: AC | PRN
  Administered 2024-02-06: 88 mg via INTRA_ARTICULAR

## 2024-02-06 MED ORDER — METHOCARBAMOL 500 MG PO TABS
500.0000 mg | ORAL_TABLET | Freq: Three times a day (TID) | ORAL | 0 refills | Status: AC | PRN
Start: 2024-02-06 — End: ?

## 2024-02-06 MED ORDER — PREDNISONE 5 MG (21) PO TBPK: ORAL_TABLET | ORAL | 0 refills | Status: DC

## 2024-02-06 MED ORDER — LIDOCAINE HCL 1 % IJ SOLN
5.0000 mL | INTRAMUSCULAR | Status: AC | PRN
  Administered 2024-02-06: 5 mL

## 2024-02-06 NOTE — Progress Notes (Signed)
 Office Visit Note   Patient: [name] [name]           Date of Birth: Feb 02, 1951           MRN: [id number] Visit Date: 02/06/2024 Requested by: Norleen Lynwood ORN, MD [address] [place],  [place] [address] PCP: Norleen Lynwood ORN, MD  Subjective: Chief Complaint  Patient presents with   Left Knee - Pain    Monovisc    Left Leg - Pain    HPI: [name] [name] is a 73 y.o. female who presents to the office reporting left knee pain.  Here for Monovisc injection which is performed today.  She also reports some left-sided radicular pain for the past 3 weeks with some numbness and tingling going down the leg.  Hard for her to stand more than 5 minutes but she does have about 15 minutes of walking endurance.  Has a history of neuropathy bilaterally.  Takes Tylenol for pain.  No anti-inflammatories or aspirin can be taken because of other medical problems..                ROS: All systems reviewed are negative as they relate to the chief complaint within the history of present illness.  Patient denies fevers or chills.  Assessment & Plan: Visit Diagnoses:  1. Pain in left leg     Plan: Impression is left knee arthritis with Monovisc injection performed patient does have fairly significant lower lumbar spondylosis.  Medrol Dosepak prescribed and Robaxin  also prescribed.  If that does not work we could consider further imaging and lumbar spine epidural steroid injections  Follow-Up Instructions: No follow-ups on file.   Orders:  Orders Placed This Encounter  Procedures   XR Lumbar Spine 2-3 Views   Meds ordered this encounter  Medications   predniSONE  (STERAPRED UNI-PAK 21 TAB) 5 MG (21) TBPK tablet    Sig: Take dosepak as directed    Dispense:  21 tablet    Refill:  0   methocarbamol  (ROBAXIN ) 500 MG tablet    Sig: Take 1 tablet (500 mg total) by mouth every 8 (eight) hours as needed for muscle spasms.    Dispense:  30 tablet    Refill:  0      Procedures: Large Joint Inj: L  knee on 02/06/2024 6:17 PM Indications: pain, joint swelling and diagnostic evaluation Details: 18 G 1.5 in needle, superolateral approach  Arthrogram: No  Medications: 5 mL lidocaine  1 %; 88 mg Hyaluronan 88 MG/4ML Outcome: tolerated well, no immediate complications Procedure, treatment alternatives, risks and benefits explained, specific risks discussed. Consent was given by the patient. Immediately prior to procedure a time out was called to verify the correct patient, procedure, equipment, support staff and site/side marked as required. Patient was prepped and draped in the usual sterile fashion.    Lot #[id number]   Clinical Data: No additional findings.  Objective: Vital Signs: There were no vitals taken for this visit.  Physical Exam:  Constitutional: Patient appears well-developed HEENT:  Head: Normocephalic Eyes:EOM are normal Neck: Normal range of motion Cardiovascular: Normal rate Pulmonary/chest: Effort normal Neurologic: Patient is alert Skin: Skin is warm Psychiatric: Patient has normal mood and affect  Ortho Exam: Ortho exam demonstrates full active and passive range of motion of both hips.  Left knee has no effusion but patellofemoral crepitus is present.  No masses lymphadenopathy or skin changes noted in the knee or back region.  No nerve root tension signs.  5  out of 5 ankle dorsiflexion plantarflexion quad and hamstring strength with no paresthesias L1-S1 bilaterally.  Specialty Comments:  No specialty comments available.  Imaging: No results found.   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   HLD (hyperlipidemia) 02/21/2020   Rash 02/20/2019   Hyperglycemia 02/20/2019   Foot joint pain 04/18/2018   Asthma 02/17/2018    CKD (chronic kidney disease) stage 3, GFR 30-59 ml/min (HCC) 02/17/2018   Peripheral neuropathy 02/18/2017   Dysuria 12/11/2015   Gout attack 12/11/2015   Dyspnea 08/14/2015   Anxiety state 08/14/2015   Allergic rhinitis 08/14/2015   Eczema 10/16/2014   Encounter for well adult exam with abnormal findings 02/12/2011   BENIGN POSITIONAL VERTIGO 02/22/2008   OBESITY 06/20/2007   Essential hypertension 06/20/2007   ANAL FISSURE, HX OF 06/20/2007   TAH/BSO, HX OF 06/20/2007   Past Medical History:  Diagnosis Date   Arthritis    knee   Eczema 10/16/2014   Hypertension    Obesity     Family History  Problem Relation Age of Onset   Coronary artery disease Mother        CABG   Hypertension Mother    Hyperlipidemia Mother    Lupus Mother    COPD Father    Hypertension Maternal Grandmother    Stroke Maternal Grandmother    Heart disease Maternal Grandfather    Stroke Paternal Grandmother    Stroke Paternal Grandfather    Cancer Neg Hx        breast or colon   Colon cancer Neg Hx     Past Surgical History:  Procedure Laterality Date   ANAL FISSURE REPAIR  1998   ARTHROSCOPIC REPAIR ACL Right 2010   DIAGNOSTIC MAMMOGRAM  2013   MYOMECTOMY  1985   TOTAL ABDOMINAL HYSTERECTOMY  1995   Social History   Occupational History   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

## 2024-02-08 ENCOUNTER — Other Ambulatory Visit: Payer: Self-pay

## 2024-02-08 DIAGNOSIS — M170 Bilateral primary osteoarthritis of knee: Secondary | ICD-10-CM

## 2024-02-10 DIAGNOSIS — L281 Prurigo nodularis: Secondary | ICD-10-CM | POA: Diagnosis not present

## 2024-02-10 DIAGNOSIS — L089 Local infection of the skin and subcutaneous tissue, unspecified: Secondary | ICD-10-CM | POA: Diagnosis not present

## 2024-02-28 ENCOUNTER — Telehealth: Payer: Self-pay | Admitting: Physical Medicine and Rehabilitation

## 2024-02-28 DIAGNOSIS — G8929 Other chronic pain: Secondary | ICD-10-CM

## 2024-02-28 DIAGNOSIS — M5442 Lumbago with sciatica, left side: Secondary | ICD-10-CM

## 2024-02-28 NOTE — Telephone Encounter (Signed)
 Pt called stating she would like to move forward with back injection. Please call pt when approved per Addie. Pt phone number is [phone].

## 2024-03-01 NOTE — Telephone Encounter (Signed)
 Yes.  Needs MRI lumbar spine first and then we can refer thanks

## 2024-03-07 ENCOUNTER — Other Ambulatory Visit: Payer: Self-pay | Admitting: Internal Medicine

## 2024-03-14 ENCOUNTER — Telehealth: Payer: Self-pay | Admitting: Radiology

## 2024-03-14 NOTE — Telephone Encounter (Signed)
 Patient left voicemail stating she had not heard anything in regards to scheduling MRI. Per appointment notes, [facility] has reached out x 2.  I called patient back and left voicemail advising she can call [phone] to schedule her MRI.  Also left my name and number if she had any other questions.

## 2024-03-15 ENCOUNTER — Encounter: Payer: Self-pay | Admitting: Orthopedic Surgery

## 2024-03-21 ENCOUNTER — Ambulatory Visit
Admission: RE | Admit: 2024-03-21 | Discharge: 2024-03-21 | Disposition: A | Discharge status: Home / Self Care | Source: Ambulatory Visit

## 2024-03-21 ENCOUNTER — Ambulatory Visit
Admission: RE | Admit: 2024-03-21 | Discharge: 2024-03-21 | Disposition: A | Discharge status: Home / Self Care | Source: Ambulatory Visit | Attending: Orthopedic Surgery | Admitting: Orthopedic Surgery

## 2024-03-21 DIAGNOSIS — M47816 Spondylosis without myelopathy or radiculopathy, lumbar region: Secondary | ICD-10-CM | POA: Diagnosis not present

## 2024-03-21 DIAGNOSIS — M5442 Lumbago with sciatica, left side: Secondary | ICD-10-CM

## 2024-04-04 ENCOUNTER — Encounter: Payer: Self-pay | Admitting: Orthopedic Surgery

## 2024-04-04 ENCOUNTER — Ambulatory Visit: Admitting: Orthopedic Surgery

## 2024-04-04 DIAGNOSIS — M5442 Lumbago with sciatica, left side: Secondary | ICD-10-CM

## 2024-04-04 DIAGNOSIS — G8929 Other chronic pain: Secondary | ICD-10-CM

## 2024-04-04 NOTE — Progress Notes (Signed)
 Office Visit Note   Patient: [name] [name]           Date of Birth: 1951-03-22           MRN: [id number] Visit Date: 04/04/2024 Requested by: Norleen Lynwood ORN, MD [address] [place],  [place] [address] PCP: Norleen Lynwood ORN, MD  Subjective: Chief Complaint  Patient presents with   Other    Follow up to review lumbar MRI    HPI: [name] [name] is a 73 y.o. female who presents to the office reporting continued low back pain.  Since she was last seen she has had an MRI scan of the lumbar spine.  Still is having some left-sided pain which radiates down to the left leg.  Actually stays there until she sits down and then it gets better.  Also wears a patellar tendon strap on the around that left knee.  Knee injection previous visit did not help her knee symptoms much.  MRI scan does show moderate left foraminal narrowing at L5-S1 which is likely affecting the left L5 nerve root..                ROS: All systems reviewed are negative as they relate to the chief complaint within the history of present illness.  Patient denies fevers or chills.  Assessment & Plan: Visit Diagnoses:  1. Chronic bilateral low back pain with left-sided sciatica   2. Low back pain with left-sided sciatica, unspecified back pain laterality, unspecified chronicity     Plan: Impression is low back pain with left-sided radiculopathy.  Plan is epidural steroid injection with Dr. Eldonna.  Also stretching and core strengthening exercises could be helpful.  She will follow-up with us  as needed.  Follow-Up Instructions: No follow-ups on file.   Orders:  No orders of the defined types were placed in this encounter.  No orders of the defined types were placed in this encounter.     Procedures: No procedures performed   Clinical Data: No additional findings.  Objective: Vital Signs: There were no vitals taken for this visit.  Physical Exam:  Constitutional: Patient appears well-developed HEENT:   Head: Normocephalic Eyes:EOM are normal Neck: Normal range of motion Cardiovascular: Normal rate Pulmonary/chest: Effort normal Neurologic: Patient is alert Skin: Skin is warm Psychiatric: Patient has normal mood and affect  Ortho Exam: Ortho exam demonstrates normal gait and alignment.  Has good range of motion of both knees.  Good strength.  Otherwise her orthopedic exam remains the same as last clinic visit.  Specialty Comments:  No specialty comments available.  Imaging: No results found.   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   HLD (hyperlipidemia) 02/21/2020   Rash 02/20/2019   Hyperglycemia 02/20/2019   Foot joint pain 04/18/2018   Asthma 02/17/2018   CKD (chronic kidney disease) stage 3, GFR 30-59 ml/min (HCC) 02/17/2018   Peripheral neuropathy 02/18/2017   Dysuria 12/11/2015   Gout attack 12/11/2015   Dyspnea 08/14/2015   Anxiety state 08/14/2015   Allergic rhinitis 08/14/2015   Eczema 10/16/2014   Encounter for well adult exam with abnormal findings 02/12/2011   BENIGN POSITIONAL VERTIGO 02/22/2008   OBESITY 06/20/2007   Essential hypertension 06/20/2007   ANAL FISSURE, HX OF 06/20/2007  TAH/BSO, HX OF 06/20/2007   Past Medical History:  Diagnosis Date   Arthritis    knee   Eczema 10/16/2014   Hypertension    Obesity     Family History  Problem Relation Age of Onset   Coronary artery disease Mother        CABG   Hypertension Mother    Hyperlipidemia Mother    Lupus Mother    COPD Father    Hypertension Maternal Grandmother    Stroke Maternal Grandmother    Heart disease Maternal Grandfather    Stroke Paternal Grandmother    Stroke Paternal Grandfather    Cancer Neg Hx        breast or colon   Colon  cancer Neg Hx     Past Surgical History:  Procedure Laterality Date   ANAL FISSURE REPAIR  1998   ARTHROSCOPIC REPAIR ACL Right 2010   DIAGNOSTIC MAMMOGRAM  2013   MYOMECTOMY  1985   TOTAL ABDOMINAL HYSTERECTOMY  1995   Social History   Occupational History   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

## 2024-04-05 ENCOUNTER — Ambulatory Visit: Payer: Self-pay | Admitting: Internal Medicine

## 2024-04-05 ENCOUNTER — Ambulatory Visit: Admitting: Internal Medicine

## 2024-04-05 ENCOUNTER — Encounter: Payer: Self-pay | Admitting: Internal Medicine

## 2024-04-05 VITALS — BP 130/84 | HR 99 | Temp 98.4°F | Ht 68.0 in | Wt 234.0 lb

## 2024-04-05 DIAGNOSIS — R739 Hyperglycemia, unspecified: Secondary | ICD-10-CM

## 2024-04-05 DIAGNOSIS — Z1211 Encounter for screening for malignant neoplasm of colon: Secondary | ICD-10-CM | POA: Diagnosis not present

## 2024-04-05 DIAGNOSIS — E538 Deficiency of other specified B group vitamins: Secondary | ICD-10-CM | POA: Diagnosis not present

## 2024-04-05 DIAGNOSIS — N1831 Chronic kidney disease, stage 3a: Secondary | ICD-10-CM

## 2024-04-05 DIAGNOSIS — I10 Essential (primary) hypertension: Secondary | ICD-10-CM

## 2024-04-05 DIAGNOSIS — Z0001 Encounter for general adult medical examination with abnormal findings: Secondary | ICD-10-CM

## 2024-04-05 DIAGNOSIS — E7800 Pure hypercholesterolemia, unspecified: Secondary | ICD-10-CM | POA: Diagnosis not present

## 2024-04-05 DIAGNOSIS — Z23 Encounter for immunization: Secondary | ICD-10-CM

## 2024-04-05 DIAGNOSIS — E559 Vitamin D deficiency, unspecified: Secondary | ICD-10-CM

## 2024-04-05 LAB — HEPATIC FUNCTION PANEL
ALT: 22 U/L (ref 0–35)
AST: 24 U/L (ref 0–37)
Albumin: 4.5 g/dL (ref 3.5–5.2)
Alkaline Phosphatase: 60 U/L (ref 39–117)
Bilirubin, Direct: 0.2 mg/dL (ref 0.0–0.3)
Total Bilirubin: 0.8 mg/dL (ref 0.2–1.2)
Total Protein: 6.9 g/dL (ref 6.0–8.3)

## 2024-04-05 LAB — BASIC METABOLIC PANEL WITH GFR
BUN: 42 mg/dL — ABNORMAL HIGH (ref 6–23)
CO2: 26 meq/L (ref 19–32)
Calcium: 10.1 mg/dL (ref 8.4–10.5)
Chloride: 103 meq/L (ref 96–112)
Creatinine, Ser: 1.79 mg/dL — ABNORMAL HIGH (ref 0.40–1.20)
GFR: 27.51 mL/min — ABNORMAL LOW (ref >60.00)
Glucose, Bld: 87 mg/dL (ref 70–99)
Potassium: 4.2 meq/L (ref 3.5–5.1)
Sodium: 138 meq/L (ref 135–145)

## 2024-04-05 LAB — CBC WITH DIFFERENTIAL/PLATELET
Basophils Absolute: 0.1 K/uL (ref 0.0–0.1)
Basophils Relative: 1.4 % (ref 0.0–3.0)
Eosinophils Absolute: 0.2 K/uL (ref 0.0–0.7)
Eosinophils Relative: 3.6 % (ref 0.0–5.0)
HCT: 33.2 % — ABNORMAL LOW (ref 36.0–46.0)
Hemoglobin: 11.6 g/dL — ABNORMAL LOW (ref 12.0–15.0)
Lymphocytes Relative: 27.3 % (ref 12.0–46.0)
Lymphs Abs: 1.8 K/uL (ref 0.7–4.0)
MCHC: 34.8 g/dL (ref 30.0–36.0)
MCV: 100.6 fl — ABNORMAL HIGH (ref 78.0–100.0)
Monocytes Absolute: 0.4 K/uL (ref 0.1–1.0)
Monocytes Relative: 5.5 % (ref 3.0–12.0)
Neutro Abs: 4.1 K/uL (ref 1.4–7.7)
Neutrophils Relative %: 62.2 % (ref 43.0–77.0)
Platelets: 142 K/uL — ABNORMAL LOW (ref 150.0–400.0)
RBC: 3.3 Mil/uL — ABNORMAL LOW (ref 3.87–5.11)
RDW: 15.9 % — ABNORMAL HIGH (ref 11.5–15.5)
WBC: 6.6 K/uL (ref 4.0–10.5)

## 2024-04-05 LAB — LIPID PANEL
Cholesterol: 128 mg/dL (ref 0–200)
HDL: 65 mg/dL (ref >39.00)
LDL Cholesterol: 39 mg/dL (ref 0–99)
NonHDL: 62.76
Total CHOL/HDL Ratio: 2
Triglycerides: 120 mg/dL (ref 0.0–149.0)
VLDL: 24 mg/dL (ref 0.0–40.0)

## 2024-04-05 LAB — VITAMIN B12: Vitamin B-12: 469 pg/mL (ref 211–911)

## 2024-04-05 LAB — HEMOGLOBIN A1C: Hgb A1c MFr Bld: 5.7 % (ref 4.6–6.5)

## 2024-04-05 LAB — VITAMIN D 25 HYDROXY (VIT D DEFICIENCY, FRACTURES): VITD: 96.29 ng/mL (ref 30.00–100.00)

## 2024-04-05 LAB — TSH: TSH: 1.43 u[IU]/mL (ref 0.35–5.50)

## 2024-04-05 MED ORDER — LISINOPRIL 20 MG PO TABS: ORAL_TABLET | ORAL | 3 refills | Status: AC

## 2024-04-05 MED ORDER — ALBUTEROL SULFATE HFA 108 (90 BASE) MCG/ACT IN AERS: 2.0000 | INHALATION_SPRAY | Freq: Four times a day (QID) | RESPIRATORY_TRACT | 5 refills | Status: AC | PRN

## 2024-04-05 MED ORDER — BUDESONIDE-FORMOTEROL FUMARATE 160-4.5 MCG/ACT IN AERO: 2.0000 | INHALATION_SPRAY | Freq: Two times a day (BID) | RESPIRATORY_TRACT | 3 refills | Status: AC

## 2024-04-05 MED ORDER — HYDROCHLOROTHIAZIDE 25 MG PO TABS: ORAL_TABLET | ORAL | 3 refills | Status: AC

## 2024-04-05 MED ORDER — ATORVASTATIN CALCIUM 10 MG PO TABS: 10.0000 mg | ORAL_TABLET | Freq: Every day | ORAL | 3 refills | Status: AC

## 2024-04-05 NOTE — Patient Instructions (Addendum)
 Please consider having the Tdap tetanus and shingles shots at the pharmacy  You had the flu shot today  Please continue all other medications as before, and refills have been done for the inhalers hardcopy (and other meds)  Please have the pharmacy call with any other refills you may need.  Please continue your efforts at being more active, low cholesterol diet, and weight control.  You are otherwise up to date with prevention measures today.  Please keep your appointments with your specialists as you may have planned  You will be contacted regarding the referral for: colonoscopy  Please go to the LAB at the blood drawing area for the tests to be done  You will be contacted by phone if any changes need to be made immediately.  Otherwise, you will receive a letter about your results with an explanation, but please check with MyChart first.  Please make an Appointment to return for your 1 year visit, or sooner if needed

## 2024-04-05 NOTE — Progress Notes (Signed)
 Patient ID: [name] [name], female   DOB: 06/30/1951, 73 y.o.   MRN: [id number]         Chief Complaint:: wellness exam and low vit d, hyperglycemia, hld. Htn, ckd3a        HPI:  [name] [name] is a 73 y.o. female here for wellness exam; due for oct 7 colonoscopy, for tdap and shingrix at pharmacy, o/w up to date, due for flu shot                        Also has ongoing left sciatica with Dr Addie for [facility] soon. No falls or weakness, though balance not as good.   renal twice per yr.  Had gel shot to left knee but not improved, pt conts to avoid TKR for now.  Has some feet numbness. Sees rheumatology for allopurinol.  Pt denies chest pain, increased sob or doe, wheezing, orthopnea, PND, increased LE swelling, palpitations, dizziness or syncope.   Pt denies polydipsia, polyuria, or new focal neuro s/s.    Pt denies fever, wt loss, night sweats, loss of appetite, or other constitutional symptoms    Wt Readings from Last 3 Encounters:  04/05/24 234 lb (106.1 kg)  11/16/23 241 lb (109.3 kg)  04/04/23 241 lb (109.3 kg)   BP Readings from Last 3 Encounters:  04/05/24 130/84  04/04/23 126/82  04/02/22 118/60   Immunization History  Administered Date(s) Administered   Fluad Quad(high Dose 65+) 03/23/2019, 03/31/2021, 05/07/2021, 04/02/2022   Fluad Trivalent(High Dose 65+) 04/04/2023   INFLUENZA, HIGH DOSE SEASONAL PF 05/24/2017, 05/19/2018, 04/05/2024   Influenza, Seasonal, Injecte, Preservative Fre 07/18/2012   Influenza,inj,Quad PF,6+ Mos 08/23/2013, 08/14/2015   Moderna Covid-19 Vaccine Bivalent Booster 54yrs & up 05/26/2021   Moderna Sars-Covid-2 Vaccination 08/22/2019, 09/19/2019, 04/21/2020, 10/20/2020   Pneumococcal Conjugate-13 01/01/2014   Pneumococcal Polysaccharide-23 02/11/2011, 02/17/2018   Tdap 02/11/2011   Zoster, Live 01/15/2014   Health Maintenance Due  Topic Date Due   Zoster Vaccines- Shingrix (1 of 2) 04/21/1968   DTaP/Tdap/Td (2 - Td or Tdap) 02/10/2021    Colonoscopy  04/24/2024      Past Medical History:  Diagnosis Date   Arthritis    knee   Eczema 10/16/2014   Hypertension    Obesity    Past Surgical History:  Procedure Laterality Date   ANAL FISSURE REPAIR  1998   ARTHROSCOPIC REPAIR ACL Right 2010   DIAGNOSTIC MAMMOGRAM  2013   MYOMECTOMY  1985   TOTAL ABDOMINAL HYSTERECTOMY  1995    reports that she has never smoked. She has never used smokeless tobacco. She reports that she does not drink alcohol and does not use drugs. family history includes COPD in her father; Coronary artery disease in her mother; Heart disease in her maternal grandfather; Hyperlipidemia in her mother; Hypertension in her maternal grandmother and mother; Lupus in her mother; Stroke in her maternal grandmother, paternal grandfather, and paternal grandmother. Allergies  Allergen Reactions   Indomethacin  Other (See Comments)    The reaction is more of an intolerance. Patient states that she has nausea, dizziness and agitation.    Current Outpatient Medications on File Prior to Visit  Medication Sig Dispense Refill   allopurinol (ZYLOPRIM) 300 MG tablet Take 1 tablet by mouth daily.     aspirin 81 MG tablet Take 81 mg by mouth daily.       Cholecalciferol 125 MCG (5000 UT) TABS Take by mouth.     Colchicine  0.6 MG CAPS Take 1 capsule by mouth daily.     gabapentin (NEURONTIN) 100 MG capsule Take 100 mg by mouth at bedtime.     methocarbamol  (ROBAXIN ) 500 MG tablet Take 1 tablet (500 mg total) by mouth every 8 (eight) hours as needed for muscle spasms. 30 tablet 0   predniSONE  (STERAPRED UNI-PAK 21 TAB) 5 MG (21) TBPK tablet Take dosepak as directed 21 tablet 0   PREDNISONE  PO Take by mouth as needed.     No current facility-administered medications on file prior to visit.        ROS:  All others reviewed and negative.  Objective        PE:  BP 130/84 (BP Location: Right Arm, Patient Position: Sitting, Cuff Size: Normal)   Pulse 99   Temp 98.4 F  (36.9 C) (Oral)   Ht 5' 8 (1.727 m)   Wt 234 lb (106.1 kg)   SpO2 97%   BMI 35.58 kg/m                 Constitutional: Pt appears in NAD               HENT: Head: NCAT.                Right Ear: External ear normal.                 Left Ear: External ear normal.                Eyes: . Pupils are equal, round, and reactive to light. Conjunctivae and EOM are normal               Nose: without d/c or deformity               Neck: Neck supple. Gross normal ROM               Cardiovascular: Normal rate and regular rhythm.                 Pulmonary/Chest: Effort normal and breath sounds without rales or wheezing.                Abd:  Soft, NT, ND, + BS, no organomegaly               Neurological: Pt is alert. At baseline orientation, motor grossly intact               Skin: Skin is warm. No rashes, no other new lesions, LE edema - none               Psychiatric: Pt behavior is normal without agitation   Micro: none  Cardiac tracings I have personally interpreted today:  none  Pertinent Radiological findings (summarize): none   Lab Results  Component Value Date   WBC 6.6 04/05/2024   HGB 11.6 (L) 04/05/2024   HCT 33.2 (L) 04/05/2024   PLT 142.0 (L) 04/05/2024   GLUCOSE 87 04/05/2024   CHOL 128 04/05/2024   TRIG 120.0 04/05/2024   HDL 65.00 04/05/2024   LDLDIRECT 84.0 02/17/2018   LDLCALC 39 04/05/2024   ALT 22 04/05/2024   AST 24 04/05/2024   NA 138 04/05/2024   K 4.2 04/05/2024   CL 103 04/05/2024   CREATININE 1.79 (H) 04/05/2024   BUN 42 (H) 04/05/2024   CO2 26 04/05/2024   TSH 1.43 04/05/2024   HGBA1C 5.7 04/05/2024   Assessment/Plan:  [name] [name] is  a 73 y.o. White or Caucasian [1] female with  has a past medical history of Arthritis, Eczema (10/16/2014), Hypertension, and Obesity.  Encounter for well adult exam with abnormal findings Age and sex appropriate education and counseling updated with regular exercise and diet Referrals for preventative services -  for oct 7 colonoscopy Immunizations addressed - for tdap and shingrix at pharmacy, for flu shot today Smoking counseling  - none needed Evidence for depression or other mood disorder - none significant Most recent labs reviewed. I have personally reviewed and have noted: 1) the patient's medical and social history 2) The patient's current medications and supplements 3) The patient's height, weight, and BMI have been recorded in the chart   Vitamin D  deficiency Last vitamin D  Lab Results  Component Value Date   VD25OH 96.29 04/05/2024   Stable, cont oral replacement   Hyperglycemia Lab Results  Component Value Date   HGBA1C 5.7 04/05/2024   Stable, pt to continue current medical treatment  - diet, wt control   HLD (hyperlipidemia) Lab Results  Component Value Date   LDLCALC 39 04/05/2024   Stable, pt to continue current statin li;itor 10 mg qd   Essential hypertension BP Readings from Last 3 Encounters:  04/05/24 130/84  04/04/23 126/82  04/02/22 118/60   Stable, pt to continue medical treatment hct 25 every day, lisinopril  20 mg qd   CKD (chronic kidney disease) stage 3, GFR 30-59 ml/min (HCC) Lab Results  Component Value Date   CREATININE 1.79 (H) 04/05/2024   Stable overall, cont to avoid nephrotoxins   B12 deficiency Lab Results  Component Value Date   VITAMINB12 469 04/05/2024   Stable, cont oral replacement - b12 1000 mcg qd  Followup: Return in about 1 year (around 04/05/2025).  Lynwood Rush, MD 04/07/2024 7:15 PM [facility]

## 2024-04-07 ENCOUNTER — Encounter: Payer: Self-pay | Admitting: Internal Medicine

## 2024-04-07 NOTE — Assessment & Plan Note (Signed)
 Lab Results  Component Value Date   CREATININE 1.79 (H) 04/05/2024   Stable overall, cont to avoid nephrotoxins

## 2024-04-07 NOTE — Assessment & Plan Note (Signed)
 Lab Results  Component Value Date   LDLCALC 39 04/05/2024   Stable, pt to continue current statin li;itor 10 mg qd

## 2024-04-07 NOTE — Assessment & Plan Note (Signed)
 Lab Results  Component Value Date   HGBA1C 5.7 04/05/2024   Stable, pt to continue current medical treatment  - diet, wt control

## 2024-04-07 NOTE — Assessment & Plan Note (Addendum)
 Age and sex appropriate education and counseling updated with regular exercise and diet Referrals for preventative services - for oct 7 colonoscopy Immunizations addressed - for tdap and shingrix at pharmacy, for flu shot today Smoking counseling  - none needed Evidence for depression or other mood disorder - none significant Most recent labs reviewed. I have personally reviewed and have noted: 1) the patient's medical and social history 2) The patient's current medications and supplements 3) The patient's height, weight, and BMI have been recorded in the chart

## 2024-04-07 NOTE — Assessment & Plan Note (Signed)
 Last vitamin D  Lab Results  Component Value Date   VD25OH 96.29 04/05/2024   Stable, cont oral replacement

## 2024-04-07 NOTE — Assessment & Plan Note (Signed)
 BP Readings from Last 3 Encounters:  04/05/24 130/84  04/04/23 126/82  04/02/22 118/60   Stable, pt to continue medical treatment hct 25 every day, lisinopril  20 mg qd

## 2024-04-07 NOTE — Assessment & Plan Note (Signed)
 Lab Results  Component Value Date   VITAMINB12 469 04/05/2024   Stable, cont oral replacement - b12 1000 mcg qd

## 2024-04-12 ENCOUNTER — Ambulatory Visit: Payer: Self-pay | Admitting: Orthopedic Surgery

## 2024-04-30 ENCOUNTER — Ambulatory Visit: Admitting: Physical Medicine and Rehabilitation

## 2024-04-30 ENCOUNTER — Other Ambulatory Visit: Payer: Self-pay

## 2024-04-30 VITALS — BP 122/76 | HR 105

## 2024-04-30 DIAGNOSIS — G8929 Other chronic pain: Secondary | ICD-10-CM | POA: Diagnosis not present

## 2024-04-30 DIAGNOSIS — M5416 Radiculopathy, lumbar region: Secondary | ICD-10-CM | POA: Diagnosis not present

## 2024-04-30 DIAGNOSIS — M47816 Spondylosis without myelopathy or radiculopathy, lumbar region: Secondary | ICD-10-CM | POA: Diagnosis not present

## 2024-04-30 DIAGNOSIS — M545 Low back pain, unspecified: Secondary | ICD-10-CM | POA: Diagnosis not present

## 2024-04-30 DIAGNOSIS — R269 Unspecified abnormalities of gait and mobility: Secondary | ICD-10-CM

## 2024-04-30 MED ORDER — METHYLPREDNISOLONE ACETATE 80 MG/ML IJ SUSP: 40.0000 mg | Freq: Once | INTRAMUSCULAR | Status: DC

## 2024-04-30 NOTE — Progress Notes (Signed)
 Pain Scale   Average Pain 6 Patient advising she has lower back pain radiating to both legs standing and walking increases pain and sitting relieves his pain.        +Driver, -BT, -Dye Allergies.

## 2024-05-07 ENCOUNTER — Encounter: Payer: Self-pay | Admitting: Physical Medicine and Rehabilitation

## 2024-05-07 NOTE — Progress Notes (Signed)
 [name] [name] - 73 y.o. female MRN [id number]  Date of birth: October 15, 1950  Office Visit Note: Visit Date: 04/30/2024 PCP: Norleen Lynwood ORN, MD Referred by: Norleen Lynwood ORN, MD  Subjective: Chief Complaint  Patient presents with   Lower Back - Pain   HPI: [name] [name] is a 73 y.o. female who comes in today for evaluation and management at the request of Dr. JUDITHANN Glendia Hutchinson for chronic worsening severe axial low back pain with difficulty ambulating and standing.  She has been seeing Dr. Hutchinson for her knees.  Felt like a lot of her pain she was having was radicular pain down the leg.  MRI was obtained and this is reviewed in detail below.  I reviewed with the patient today.  It really does not show any nerve compression there is degenerative disc height loss but that is usually not a source of issue unless there is severe foraminal narrowing which there is not.  There is no high-grade central stenosis.  There is some curvature.  She does have facet arthropathy throughout the lumbar spine if she L3-4 and L4-5.  She has had no focal weakness or other concerning symptoms.  She has multiple medical issues with a history of polyneuropathy and anxiety and gait disorder.   I spent more than 30 minutes speaking face-to-face with the patient with 50% of the time in counseling and discussing coordination of care.       Review of Systems  Musculoskeletal:  Positive for back pain and joint pain.  Neurological:  Positive for weakness.  All other systems reviewed and are negative.  Otherwise per HPI.  Assessment & Plan: Visit Diagnoses:    ICD-10-CM   1. Spondylosis without myelopathy or radiculopathy, lumbar region  M47.816 Ambulatory referral to Physical Therapy    Ambulatory referral to Physical Medicine Rehab    2. Chronic bilateral low back pain without sciatica  M54.50 Ambulatory referral to Physical Therapy   G89.29 Ambulatory referral to Physical Medicine Rehab    3. Gait disorder  R26.9  Ambulatory referral to Physical Therapy    4. Lumbar radiculopathy  M54.16 DISCONTINUED: methylPREDNISolone acetate (DEPO-MEDROL) injection 40 mg    CANCELED: XR C-ARM NO REPORT    CANCELED: Epidural Steroid injection       Plan: Findings:  1.  Without seeing the patient, we had had her coming in for epidural injection.  After evaluation however I do feel like this is more facet mediated back pain with the issue of gait abnormality and gait disorder.  She does not really have MRI findings that would be suggestive of true radiculopathy.  I want to get her back for facet joint blocks diagnostically and hopefully therapeutically.  She could be a candidate for radiofrequency ablation.  2.  Gait abnormality and disorder.  Referral to physical therapy.    Meds & Orders:  Meds ordered this encounter  Medications   DISCONTD: methylPREDNISolone acetate (DEPO-MEDROL) injection 40 mg    Orders Placed This Encounter  Procedures   Ambulatory referral to Physical Therapy   Ambulatory referral to Physical Medicine Rehab    Follow-up: Return for Bilateral L4-5 or L3-4 facet joint block.   Procedures: No procedures performed      Clinical History: MRI LUMBAR SPINE WITHOUT CONTRAST   TECHNIQUE: Multiplanar, multisequence MR imaging of the lumbar spine was performed. No intravenous contrast was administered.   COMPARISON:  Lumbar spine radiographs 02/06/2024.   FINDINGS: Segmentation: Conventional anatomy assumed, with the  last open disc space designated L5-S1.Concordant with prior radiographs.   Alignment: Mild convex left scoliosis. No focal angulation or significant listhesis.   Vertebrae: No worrisome osseous lesion, acute fracture or pars defect. Chronic endplate degenerative changes, greatest on the right at L4-5.   Conus medullaris: Extends to the L2 level. The conus and cauda equina appear normal.   Paraspinal and other soft tissues: No significant paraspinal findings.    Disc levels:   Sagittal images demonstrate no significant disc space findings within the visualized lower thoracic spine.   L1-2: Normal interspace.   L2-3: Loss of disc height with disc bulging and anterior osteophytes. No spinal stenosis or significant foraminal narrowing.   L3-4: Preserved disc height with mild annular disc bulging. Moderate facet and ligamentous hypertrophy with small bilateral facet joint effusions. Mild spinal stenosis with mild lateral recess and foraminal narrowing bilaterally. No evidence of nerve root impingement.   L4-5: Moderate loss of disc height with annular disc bulging and endplate osteophytes asymmetric to the right. Mild facet and ligamentous hypertrophy. No significant spinal stenosis. There is mild narrowing of the left lateral recess and both foramina without definite nerve root impingement.   L5-S1: Chronic loss of disc height with annular disc bulging and endplate osteophytes asymmetric to the left. Mild bilateral facet hypertrophy. No significant spinal stenosis. Moderate left foraminal narrowing which could affect the exiting left L5 nerve root. The right foramen and lateral recesses appear adequately patent.   IMPRESSION: 1. Multilevel spondylosis associated with a mild convex left scoliosis. 2. Mild multifactorial spinal stenosis at L3-4 with mild lateral recess and foraminal narrowing bilaterally. 3. Mild left lateral recess and foraminal narrowing bilaterally at L4-5. 4. Moderate left foraminal narrowing at L5-S1 which could affect the exiting left L5 nerve root.     Electronically Signed   By: Elsie Perone M.D.   On: 03/29/2024   She reports that she has never smoked. She has never used smokeless tobacco.  Recent Labs    04/05/24 1407  HGBA1C 5.7    Objective:  VS:  HT:    WT:   BMI:     BP:122/76  HR:(!) 105bpm  TEMP: ( )  RESP:  Physical Exam Vitals and nursing note reviewed.  Constitutional:       General: She is not in acute distress.    Appearance: Normal appearance. She is well-developed. She is obese. She is not ill-appearing.  HENT:     Head: Normocephalic and atraumatic.  Eyes:     Conjunctiva/sclera: Conjunctivae normal.     Pupils: Pupils are equal, round, and reactive to light.  Cardiovascular:     Rate and Rhythm: Normal rate.     Pulses: Normal pulses.  Pulmonary:     Effort: Pulmonary effort is normal.  Musculoskeletal:     Right lower leg: No edema.     Left lower leg: No edema.     Comments: Patient ambulates with difficulty.  She is slow to go from a sit to stand position.  She has pain with standing and facet loading.  No pain really over the greater trochanters.  No pain with hip rotation.  Good distal strength.  Skin:    General: Skin is warm and dry.     Findings: No erythema or rash.  Neurological:     General: No focal deficit present.     Mental Status: She is alert and oriented to person, place, and time.     Cranial Nerves: No cranial  nerve deficit.     Sensory: No sensory deficit.     Motor: Weakness present. No abnormal muscle tone.     Coordination: Coordination normal.     Gait: Gait abnormal.  Psychiatric:        Mood and Affect: Mood normal.        Behavior: Behavior normal.     Ortho Exam  Imaging: No results found.  Past Medical/Family/Surgical/Social History: Medications & Allergies reviewed per EMR, new medications updated. 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   HLD (hyperlipidemia) 02/21/2020   Rash 02/20/2019   Hyperglycemia 02/20/2019   Foot joint pain 04/18/2018   Asthma 02/17/2018   CKD (chronic kidney disease) stage 3, GFR 30-59 ml/min (HCC) 02/17/2018   Peripheral neuropathy 02/18/2017   Dysuria  12/11/2015   Gout attack 12/11/2015   Dyspnea 08/14/2015   Anxiety state 08/14/2015   Allergic rhinitis 08/14/2015   Eczema 10/16/2014   Encounter for well adult exam with abnormal findings 02/12/2011   BENIGN POSITIONAL VERTIGO 02/22/2008   OBESITY 06/20/2007   Essential hypertension 06/20/2007   ANAL FISSURE, HX OF 06/20/2007   TAH/BSO, HX OF 06/20/2007   Past Medical History:  Diagnosis Date   Arthritis    knee   Eczema 10/16/2014   Hypertension    Obesity    Family History  Problem Relation Age of Onset   Coronary artery disease Mother        CABG   Hypertension Mother    Hyperlipidemia Mother    Lupus Mother    COPD Father    Hypertension Maternal Grandmother    Stroke Maternal Grandmother    Heart disease Maternal Grandfather    Stroke Paternal Grandmother    Stroke Paternal Grandfather    Cancer Neg Hx        breast or colon   Colon cancer Neg Hx    Past Surgical History:  Procedure Laterality Date   ANAL FISSURE REPAIR  1998   ARTHROSCOPIC REPAIR ACL Right 2010   DIAGNOSTIC MAMMOGRAM  2013   MYOMECTOMY  1985   TOTAL ABDOMINAL HYSTERECTOMY  1995   Social History   Occupational History   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

## 2024-05-22 ENCOUNTER — Telehealth: Payer: Self-pay

## 2024-05-22 NOTE — Telephone Encounter (Signed)
 Patient asking if she has a referral to PT, she hasn't received a call from them yet, can you check?

## 2024-06-13 ENCOUNTER — Other Ambulatory Visit: Payer: Self-pay

## 2024-06-13 ENCOUNTER — Ambulatory Visit: Admitting: Physical Medicine and Rehabilitation

## 2024-06-13 VITALS — BP 119/75 | HR 81

## 2024-06-13 DIAGNOSIS — M47816 Spondylosis without myelopathy or radiculopathy, lumbar region: Secondary | ICD-10-CM

## 2024-06-13 MED ORDER — METHYLPREDNISOLONE ACETATE 40 MG/ML IJ SUSP
40.0000 mg | Freq: Once | INTRAMUSCULAR | Status: AC
  Administered 2024-06-13: 40 mg

## 2024-06-13 NOTE — Progress Notes (Signed)
 [name] [name] - 73 y.o. female MRN [id number]  Date of birth: 16-Sep-1950  Office Visit Note: Visit Date: 06/13/2024 PCP: Norleen Lynwood ORN, MD Referred by: Norleen Lynwood ORN, MD  Subjective: Chief Complaint  Patient presents with   Lower Back - Pain   HPI:  [name] [name] is a 73 y.o. female who comes in today  at the request of Dr. Prentice Masters for planned Bilateral  L4-5 Lumbar facet/medial branch block with fluoroscopic guidance.  The patient has failed conservative care including home exercise, medications, time and activity modification.  This injection will be diagnostic and hopefully therapeutic.  Please see requesting physician notes for further details and justification.  Exam has shown concordant pain with facet joint loading.   ROS Otherwise per HPI.  Assessment & Plan: Visit Diagnoses:    ICD-10-CM   1. Spondylosis without myelopathy or radiculopathy, lumbar region  M47.816 XR C-ARM NO REPORT    Facet Injection    methylPREDNISolone  acetate (DEPO-MEDROL ) injection 40 mg      Plan: No additional findings.   Meds & Orders:  Meds ordered this encounter  Medications   methylPREDNISolone  acetate (DEPO-MEDROL ) injection 40 mg    Orders Placed This Encounter  Procedures   Facet Injection   XR C-ARM NO REPORT    Follow-up: Return for visit to requesting provider as needed.   Procedures: No procedures performed  Lumbar Facet Joint Intra-Articular Injection(s) with Fluoroscopic Guidance  Patient: [name] [name]      Date of Birth: Mar 23, 1951 MRN: [id number] PCP: Norleen Lynwood ORN, MD      Visit Date: 06/13/2024   Universal Protocol:    Date/Time: 06/13/2024  Consent Given By: the patient  Position: PRONE   Additional Comments: Vital signs were monitored before and after the procedure. Patient was prepped and draped in the usual sterile fashion. The correct patient, procedure, and site was verified.   Injection Procedure Details:  Procedure Site One Meds  Administered:  Meds ordered this encounter  Medications   methylPREDNISolone  acetate (DEPO-MEDROL ) injection 40 mg     Laterality: Bilateral  Location/Site:  L4-L5  Needle size: 22 guage  Needle type: Spinal  Needle Placement: Articular  Findings:  -Comments: Excellent flow of contrast producing a partial arthrogram.  Procedure Details: The fluoroscope beam is vertically oriented in AP, and the inferior recess is visualized beneath the lower pole of the inferior apophyseal process, which represents the target point for needle insertion. When direct visualization is difficult the target point is located at the medial projection of the vertebral pedicle. The region overlying each aforementioned target is locally anesthetized with a 1 to 2 ml. volume of 1% Lidocaine  without Epinephrine.   The spinal needle was inserted into each of the above mentioned facet joints using biplanar fluoroscopic guidance. A 0.25 to 0.5 ml. volume of Isovue-250 was injected and a partial facet joint arthrogram was obtained. A single spot film was obtained of the resulting arthrogram.    One to 1.25 ml of the steroid/anesthetic solution was then injected into each of the facet joints noted above.   Additional Comments:  The patient tolerated the procedure well Dressing: 2 x 2 sterile gauze and Band-Aid    Post-procedure details: Patient was observed during the procedure. Post-procedure instructions were reviewed.  Patient left the clinic in stable condition.    Clinical History: MRI LUMBAR SPINE WITHOUT CONTRAST   TECHNIQUE: Multiplanar, multisequence MR imaging of the lumbar spine was performed. No intravenous contrast was  administered.   COMPARISON:  Lumbar spine radiographs 02/06/2024.   FINDINGS: Segmentation: Conventional anatomy assumed, with the last open disc space designated L5-S1.Concordant with prior radiographs.   Alignment: Mild convex left scoliosis. No focal angulation  or significant listhesis.   Vertebrae: No worrisome osseous lesion, acute fracture or pars defect. Chronic endplate degenerative changes, greatest on the right at L4-5.   Conus medullaris: Extends to the L2 level. The conus and cauda equina appear normal.   Paraspinal and other soft tissues: No significant paraspinal findings.   Disc levels:   Sagittal images demonstrate no significant disc space findings within the visualized lower thoracic spine.   L1-2: Normal interspace.   L2-3: Loss of disc height with disc bulging and anterior osteophytes. No spinal stenosis or significant foraminal narrowing.   L3-4: Preserved disc height with mild annular disc bulging. Moderate facet and ligamentous hypertrophy with small bilateral facet joint effusions. Mild spinal stenosis with mild lateral recess and foraminal narrowing bilaterally. No evidence of nerve root impingement.   L4-5: Moderate loss of disc height with annular disc bulging and endplate osteophytes asymmetric to the right. Mild facet and ligamentous hypertrophy. No significant spinal stenosis. There is mild narrowing of the left lateral recess and both foramina without definite nerve root impingement.   L5-S1: Chronic loss of disc height with annular disc bulging and endplate osteophytes asymmetric to the left. Mild bilateral facet hypertrophy. No significant spinal stenosis. Moderate left foraminal narrowing which could affect the exiting left L5 nerve root. The right foramen and lateral recesses appear adequately patent.   IMPRESSION: 1. Multilevel spondylosis associated with a mild convex left scoliosis. 2. Mild multifactorial spinal stenosis at L3-4 with mild lateral recess and foraminal narrowing bilaterally. 3. Mild left lateral recess and foraminal narrowing bilaterally at L4-5. 4. Moderate left foraminal narrowing at L5-S1 which could affect the exiting left L5 nerve root.     Electronically Signed    By: Elsie Perone M.D.   On: 03/29/2024     Objective:  VS:  HT:    WT:   BMI:     BP:119/75  HR:81bpm  TEMP: ( )  RESP:  Physical Exam Vitals and nursing note reviewed.  Constitutional:      General: She is not in acute distress.    Appearance: Normal appearance. She is obese. She is not ill-appearing.  HENT:     Head: Normocephalic and atraumatic.     Right Ear: External ear normal.     Left Ear: External ear normal.  Eyes:     Extraocular Movements: Extraocular movements intact.  Cardiovascular:     Rate and Rhythm: Normal rate.     Pulses: Normal pulses.  Pulmonary:     Effort: Pulmonary effort is normal. No respiratory distress.  Abdominal:     General: There is no distension.     Palpations: Abdomen is soft.  Musculoskeletal:        General: Tenderness present.     Cervical back: Neck supple.     Right lower leg: No edema.     Left lower leg: No edema.     Comments: Patient has good distal strength with no pain over the greater trochanters.  No clonus or focal weakness.  Skin:    Findings: No erythema, lesion or rash.  Neurological:     General: No focal deficit present.     Mental Status: She is alert and oriented to person, place, and time.     Sensory: No  sensory deficit.     Motor: No weakness or abnormal muscle tone.     Coordination: Coordination normal.  Psychiatric:        Mood and Affect: Mood normal.        Behavior: Behavior normal.      Imaging: No results found.

## 2024-06-13 NOTE — Procedures (Signed)
 Lumbar Facet Joint Intra-Articular Injection(s) with Fluoroscopic Guidance  Patient: [name] [name]      Date of Birth: 09-14-1950 MRN: [id number] PCP: Norleen Lynwood ORN, MD      Visit Date: 06/13/2024   Universal Protocol:    Date/Time: 06/13/2024  Consent Given By: the patient  Position: PRONE   Additional Comments: Vital signs were monitored before and after the procedure. Patient was prepped and draped in the usual sterile fashion. The correct patient, procedure, and site was verified.   Injection Procedure Details:  Procedure Site One Meds Administered:  Meds ordered this encounter  Medications   methylPREDNISolone  acetate (DEPO-MEDROL ) injection 40 mg     Laterality: Bilateral  Location/Site:  L4-L5  Needle size: 22 guage  Needle type: Spinal  Needle Placement: Articular  Findings:  -Comments: Excellent flow of contrast producing a partial arthrogram.  Procedure Details: The fluoroscope beam is vertically oriented in AP, and the inferior recess is visualized beneath the lower pole of the inferior apophyseal process, which represents the target point for needle insertion. When direct visualization is difficult the target point is located at the medial projection of the vertebral pedicle. The region overlying each aforementioned target is locally anesthetized with a 1 to 2 ml. volume of 1% Lidocaine  without Epinephrine.   The spinal needle was inserted into each of the above mentioned facet joints using biplanar fluoroscopic guidance. A 0.25 to 0.5 ml. volume of Isovue-250 was injected and a partial facet joint arthrogram was obtained. A single spot film was obtained of the resulting arthrogram.    One to 1.25 ml of the steroid/anesthetic solution was then injected into each of the facet joints noted above.   Additional Comments:  The patient tolerated the procedure well Dressing: 2 x 2 sterile gauze and Band-Aid    Post-procedure details: Patient was observed  during the procedure. Post-procedure instructions were reviewed.  Patient left the clinic in stable condition.

## 2024-06-13 NOTE — Progress Notes (Signed)
 Pain Scale   Average Pain 0 Patient advising she has lower back pain at times when she walks and stands. Sitting does help with her pain.        +Driver, -BT, -Dye Allergies.

## 2024-06-25 DIAGNOSIS — Z1231 Encounter for screening mammogram for malignant neoplasm of breast: Secondary | ICD-10-CM | POA: Diagnosis not present

## 2024-06-25 LAB — HM MAMMOGRAPHY

## 2024-06-29 ENCOUNTER — Encounter: Payer: Self-pay | Admitting: Internal Medicine

## 2024-07-22 NOTE — Therapy (Signed)
 " OUTPATIENT PHYSICAL THERAPY THORACOLUMBAR EVALUATION   Patient Name: [name] [name] MRN: [id number] DOB:[birth year], 74 y.o., female Today's Date: 07/24/2024  END OF SESSION:  PT End of Session - 07/24/24 0649     Visit Number 1    Number of Visits 17    Date for Recertification  09/18/24    PT Start Time 1430    PT Stop Time 1510    PT Time Calculation (min) 40 min    Activity Tolerance Patient tolerated treatment well    Behavior During Therapy [facility] for tasks assessed/performed          Past Medical History:  Diagnosis Date   Arthritis    knee   Eczema 10/16/2014   Hypertension    Obesity    Past Surgical History:  Procedure Laterality Date   ANAL FISSURE REPAIR  1998   ARTHROSCOPIC REPAIR ACL Right 2010   DIAGNOSTIC MAMMOGRAM  2013   MYOMECTOMY  1985   TOTAL ABDOMINAL HYSTERECTOMY  1995   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   HLD (hyperlipidemia) 02/21/2020   Rash 02/20/2019   Hyperglycemia 02/20/2019   Foot joint pain 04/18/2018   Asthma 02/17/2018   CKD (chronic kidney disease) stage 3, GFR 30-59 ml/min (HCC) 02/17/2018   Peripheral neuropathy 02/18/2017   Dysuria 12/11/2015   Gout attack 12/11/2015   Dyspnea 08/14/2015   Anxiety state 08/14/2015   Allergic rhinitis 08/14/2015   Eczema 10/16/2014   Encounter for well adult exam with abnormal findings 02/12/2011   BENIGN POSITIONAL VERTIGO 02/22/2008   OBESITY 06/20/2007   Essential hypertension 06/20/2007   ANAL FISSURE, HX OF 06/20/2007   TAH/BSO, HX OF 06/20/2007    PCP: Norleen Lynwood ORN, MD   REFERRING PROVIDER: Eldonna Novel, MD   REFERRING DIAG:  [phone] (ICD-10-CM) - Spondylosis without myelopathy or radiculopathy, lumbar region  M54.50,G89.29 (ICD-10-CM) -  Chronic bilateral low back pain without sciatica  R26.9 (ICD-10-CM) - Gait disorder    MD NOTE-Short course transition to HEP - mechanical low back pain worse with standing   Rationale for Evaluation and Treatment: Rehabilitation  THERAPY DIAG:  Other low back pain  Chronic left-sided low back pain with left-sided sciatica  ONSET DATE: chronic  SUBJECTIVE:  SUBJECTIVE STATEMENT: Pt reports she was having sciatica in L LE all summer long;  and consulted with Dr Addie.  He suggested PT for the back and injections. She had 2 facet shots and it has helped LBP.  Pt reports no radiating pain now.  Does c/o L LE weakness and some L knee pain.  PERTINENT HISTORY:  ---  PAIN:  Are you having pain? Yes: NPRS scale: 5/10 Pain location: lumbar pain, ant L knee Pain description: sharp, radiating Aggravating factors: standing, transfers  Relieving factors: rest  PRECAUTIONS: None  RED FLAGS: None   WEIGHT BEARING RESTRICTIONS: No  FALLS:  Has patient fallen in last 6 months? No  LIVING ENVIRONMENT: Lives with: lives alone Lives in: House/apartment Stairs: No Has following equipment at home: Single point cane  OCCUPATION: retired  PLOF: Independent  PATIENT GOALS: increase mobility  NEXT MD VISIT: 11/16/24  OBJECTIVE:  Note: Objective measures were completed at Evaluation unless otherwise noted.  DIAGNOSTIC FINDINGS:  XR Lumbar 02/06/24 AP lateral radiographs lumbar spine reviewed.  End-stage arthritis is  present at L4-5 and L5-S1.  No spondylolisthesis.  Moderate degenerative  changes present throughout the lumbar spine above these levels.  No  compression fractures.  MR 03/29/24 IMPRESSION: 1. Multilevel spondylosis associated with a mild convex left scoliosis. 2. Mild multifactorial  spinal stenosis at L3-4 with mild lateral recess and foraminal narrowing bilaterally. 3. Mild left lateral recess and foraminal narrowing bilaterally at L4-5. 4. Moderate left foraminal narrowing at L5-S1 which could affect the exiting left L5 nerve root.    PATIENT SURVEYS:  PSFS: THE PATIENT SPECIFIC FUNCTIONAL SCALE  Place score of 0-10 (0 = unable to perform activity and 10 = able to perform activity at the same level as before injury or problem)  Activity Date: 07/23/24    Climbing stairs 3    2.up from toilet with no use of hands 0    3. Walking for 5 min without Cane in store 6    4.      Total Score 3      Total Score = Sum of activity scores/number of activities  Minimally Detectable Change: 3 points (for single activity); 2 points (for average score)  [facility] (nd). The Patient Specific Functional Scale . Retrieved from [email or web address]   COGNITION: Overall cognitive status: Within functional limits for tasks assessed     SENSATION: WFL  MUSCLE LENGTH: Hamstrings: Right mod restriction    POSTURE: rounded shoulders, decreased lumbar lordosis, flexed trunk , and genu valgum bil  PALPATION: 1+ tenderness L lumbar pvm  LUMBAR ROM:   AROM Eval 07/24/24  Flexion 75%  Extension 50%  Right lateral flexion 75%  Left lateral flexion 75%  Right rotation 100%  Left rotation 100%   (Blank rows = not tested)  LOWER EXTREMITY ROM:   WFL  Active  Right eval Left eval  Hip flexion    Hip extension    Hip abduction    Hip adduction    Hip internal rotation    Hip external rotation    Knee flexion    Knee extension    Ankle dorsiflexion    Ankle plantarflexion    Ankle inversion    Ankle eversion     (Blank rows = not tested)  LOWER EXTREMITY MMT:    MMT Right eval Left eval  Hip flexion 5- 4-  Hip extension    Hip abduction  4  Hip adduction  4+  Hip internal  rotation    Hip  external rotation    Knee flexion    Knee extension  4-  Ankle dorsiflexion  4-  Ankle plantarflexion    Ankle inversion    Ankle eversion     (Blank rows = not tested)  LUMBAR SPECIAL TESTS:  Straight leg raise test: Negative  FUNCTIONAL TESTS:  5 times sit to stand: 22 sec   GAIT: Distance walked: short  Assistive device utilized: Single point cane Level of assistance: Complete Independence Comments: house hold no AD  TREATMENT DATE: 07/24/24 Initial evaluation of lumbar spine completed followed by instruction and trial set of HEP.                                                                                                                                 PATIENT EDUCATION:  Education details: HEP Person educated: Patient Education method: Programmer, Multimedia, Facilities Manager, Actor cues, Verbal cues, and Handouts Education comprehension: verbalized understanding, returned demonstration, and verbal cues required  HOME EXERCISE PROGRAM: Access Code: [email or web address] URL: [email or web address] Date: 07/23/2024 Prepared by: Burnard Meth  Exercises - Supine Bridge  - 2 x daily - 7 x weekly - 2 sets - 10 reps - 2 hold - Supine March  - 2 x daily - 7 x weekly - 2 sets - 10 reps - Supine Lower Trunk Rotation  - 2 x daily - 7 x weekly - 2 sets - 10 reps  ASSESSMENT:  CLINICAL IMPRESSION: Patient is a 73y.o. female who was seen today for physical therapy evaluation and treatment for lumbar spine pain and restriction.  She presents with altered posture, decreased range of motion, decreased strength of lumbar stabilizers and pain.  Patient would benefit from skilled physical therapy services to address these deficits and return to previous level of activity.  OBJECTIVE IMPAIRMENTS: decreased activity tolerance, decreased mobility, decreased ROM, decreased strength, and pain.   ACTIVITY LIMITATIONS: bending, sitting, standing, and squatting  PARTICIPATION LIMITATIONS: meal prep  and community activity  PERSONAL FACTORS: --- are also affecting patient's functional outcome.   REHAB POTENTIAL: Good  CLINICAL DECISION MAKING: Stable/uncomplicated  EVALUATION COMPLEXITY: Moderate   GOALS: Goals reviewed with patient? Yes  SHORT TERM GOALS: Target date: 08/14/2024    Patient to be independent with HEP.   Baseline: Goal status: INITIAL  2.  Decrease pain by 1 level. Baseline:  Goal status: INITIAL  LONG TERM GOALS: Target date: 09/18/2024  8 weeks    Patient to be independent with self progressive lumbar stabilization HEP at time of discharge. Baseline:  Goal status: INITIAL  2.  Decrease pain to max 3 out of 10 with all activities. Baseline:  Goal status: INITIAL  3.  Increase strength of lumbar stabilizers hips, gluteals,  LE by half a grade to 1 full grade by discharge. Baseline:  Goal status: INITIAL  4.  Increase range of motion by 25% or more. Baseline:  Goal status: INITIAL  PLAN:  PT FREQUENCY: 1-2x/week  PT DURATION: 8 weeks  PLANNED INTERVENTIONS: 97164- PT Re-evaluation, 97750- Physical Performance Testing, 97110-Therapeutic exercises, 97530- Therapeutic activity, [id number]- Neuromuscular re-education, 97535- Self Care, 02859- Manual therapy, [phone]- Gait training, [phone]- Aquatic Therapy, [phone]- Electrical stimulation (unattended), 97016- Vasopneumatic device, 20560 (1-2 muscles), 20561 (3+ muscles)- Dry Needling, Patient/Family education, Balance training, Spinal mobilization, Cryotherapy, and Moist heat.  PLAN FOR NEXT SESSION: low back patient    Referring diagnosis?  M47.816 (ICD-10-CM) - Spondylosis without myelopathy or radiculopathy, lumbar region  M54.50,G89.29 (ICD-10-CM) - Chronic bilateral low back pain without sciatica  R26.9 (ICD-10-CM) - Gait disorder   Treatment diagnosis? (if different than referring diagnosis) M54.59, M54.42, G89.59 What was this (referring dx) caused by? []  Surgery []  Fall [x]  Ongoing issue []   Arthritis []  Other: ____________  Laterality: []  Rt [x]  Lt []  Both  Check all possible CPT codes:  #97110, #02469, #02887, #02464, #02859, #02883, #02885, #02835, #    *CHOOSE 10 OR LESS*    See Planned Interventions listed in the Plan section of the Evaluation.    Burnard CHRISTELLA Meth, PT 07/24/2024, 6:50 AM  "

## 2024-07-23 ENCOUNTER — Other Ambulatory Visit: Payer: Self-pay

## 2024-07-23 ENCOUNTER — Ambulatory Visit

## 2024-07-23 DIAGNOSIS — M5442 Lumbago with sciatica, left side: Secondary | ICD-10-CM

## 2024-07-23 DIAGNOSIS — G8929 Other chronic pain: Secondary | ICD-10-CM

## 2024-07-23 DIAGNOSIS — M5459 Other low back pain: Secondary | ICD-10-CM

## 2024-08-01 ENCOUNTER — Encounter

## 2024-08-05 NOTE — Therapy (Signed)
 " OUTPATIENT PHYSICAL THERAPY THORACOLUMBAR TREATMENT   Patient Name: [name] [name] MRN: [id number] DOB:02-Nov-1950, 74 y.o., female Today's Date: 08/06/2024  END OF SESSION:  PT End of Session - 08/06/24 1426     Visit Number 2    Number of Visits 17    Date for Recertification  09/18/24    PT Start Time 1345    PT Stop Time 1423    PT Time Calculation (min) 38 min    Activity Tolerance Patient tolerated treatment well    Behavior During Therapy [facility] for tasks assessed/performed           Past Medical History:  Diagnosis Date   Arthritis    knee   Eczema 10/16/2014   Hypertension    Obesity    Past Surgical History:  Procedure Laterality Date   ANAL FISSURE REPAIR  1998   ARTHROSCOPIC REPAIR ACL Right 2010   DIAGNOSTIC MAMMOGRAM  2013   MYOMECTOMY  1985   TOTAL ABDOMINAL HYSTERECTOMY  1995   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   HLD (hyperlipidemia) 02/21/2020   Rash 02/20/2019   Hyperglycemia 02/20/2019   Foot joint pain 04/18/2018   Asthma 02/17/2018   CKD (chronic kidney disease) stage 3, GFR 30-59 ml/min (HCC) 02/17/2018   Peripheral neuropathy 02/18/2017   Dysuria 12/11/2015   Gout attack 12/11/2015   Dyspnea 08/14/2015   Anxiety state 08/14/2015   Allergic rhinitis 08/14/2015   Eczema 10/16/2014   Encounter for well adult exam with abnormal findings 02/12/2011   BENIGN POSITIONAL VERTIGO 02/22/2008   OBESITY 06/20/2007   Essential hypertension 06/20/2007   ANAL FISSURE, HX OF 06/20/2007   TAH/BSO, HX OF 06/20/2007    PCP: Norleen Lynwood ORN, MD   REFERRING PROVIDER: Eldonna Novel, MD   REFERRING DIAG:  [phone] (ICD-10-CM) - Spondylosis without myelopathy or radiculopathy, lumbar region  M54.50,G89.29 (ICD-10-CM) -  Chronic bilateral low back pain without sciatica  R26.9 (ICD-10-CM) - Gait disorder    MD NOTE-Short course transition to HEP - mechanical low back pain worse with standing   Rationale for Evaluation and Treatment: Rehabilitation  THERAPY DIAG:  Other low back pain  Chronic left-sided low back pain with left-sided sciatica  ONSET DATE: chronic  SUBJECTIVE:  SUBJECTIVE STATEMENT: Pt reports after IE she  did something to the L knee.  She heard something pop and had pain and restriction.  Pt states her knee feels 50 % better.  Due to this factor she has not been able to do HEP.   PERTINENT HISTORY:  ---  PAIN:  Are you having pain? Yes: NPRS scale: 5/10 Pain location: lumbar pain, ant L knee Pain description: sharp, radiating Aggravating factors: standing, transfers  Relieving factors: rest  PRECAUTIONS: None  RED FLAGS: None   WEIGHT BEARING RESTRICTIONS: No  FALLS:  Has patient fallen in last 6 months? No  LIVING ENVIRONMENT: Lives with: lives alone Lives in: House/apartment Stairs: No Has following equipment at home: Single point cane  OCCUPATION: retired  PLOF: Independent  PATIENT GOALS: increase mobility  NEXT MD VISIT: 11/16/24  OBJECTIVE:  Note: Objective measures were completed at Evaluation unless otherwise noted.  DIAGNOSTIC FINDINGS:  XR Lumbar 02/06/24 AP lateral radiographs lumbar spine reviewed.  End-stage arthritis is  present at L4-5 and L5-S1.  No spondylolisthesis.  Moderate degenerative  changes present throughout the lumbar spine above these levels.  No  compression fractures.  MR 03/29/24 IMPRESSION: 1. Multilevel spondylosis associated with a mild convex left scoliosis. 2. Mild multifactorial spinal stenosis at L3-4 with mild lateral recess and  foraminal narrowing bilaterally. 3. Mild left lateral recess and foraminal narrowing bilaterally at L4-5. 4. Moderate left foraminal narrowing at L5-S1 which could affect the exiting left L5 nerve root.    PATIENT SURVEYS:  PSFS: THE PATIENT SPECIFIC FUNCTIONAL SCALE  Place score of 0-10 (0 = unable to perform activity and 10 = able to perform activity at the same level as before injury or problem)  Activity Date: 07/23/24    Climbing stairs 3    2.up from toilet with no use of hands 0    3. Walking for 5 min without Cane in store 6    4.      Total Score 3      Total Score = Sum of activity scores/number of activities  Minimally Detectable Change: 3 points (for single activity); 2 points (for average score)  [employer] [facility] (nd). The Patient Specific Functional Scale . Retrieved from [email or web address]   COGNITION: Overall cognitive status: Within functional limits for tasks assessed     SENSATION: WFL  MUSCLE LENGTH: Hamstrings: Right mod restriction    POSTURE: rounded shoulders, decreased lumbar lordosis, flexed trunk , and genu valgum bil  PALPATION: 1+ tenderness L lumbar pvm  LUMBAR ROM:   AROM Eval 07/24/24  Flexion 75%  Extension 50%  Right lateral flexion 75%  Left lateral flexion 75%  Right rotation 100%  Left rotation 100%   (Blank rows = not tested)  LOWER EXTREMITY ROM:   WFL  Active  Right eval Left eval  Hip flexion    Hip extension    Hip abduction    Hip adduction    Hip internal rotation    Hip external rotation    Knee flexion    Knee extension    Ankle dorsiflexion    Ankle plantarflexion    Ankle inversion    Ankle eversion     (Blank rows = not tested)  LOWER EXTREMITY MMT:    MMT Right eval Left eval  Hip flexion 5- 4-  Hip extension    Hip abduction  4  Hip adduction  4+  Hip internal rotation    Hip external rotation  Knee flexion    Knee extension   4-  Ankle dorsiflexion  4-  Ankle plantarflexion    Ankle inversion    Ankle eversion     (Blank rows = not tested)  LUMBAR SPECIAL TESTS:  Straight leg raise test: Negative  FUNCTIONAL TESTS:  5 times sit to stand: 22 sec   GAIT: Distance walked: short  Assistive device utilized: Single point cane Level of assistance: Complete Independence Comments: house hold no AD  TREATMENT DATE: 08/06/24 TherEx Lumbar trunk rotations 20x2 LAQ no weight  Neuro Re-Ed Seated TB hip abduction 2x15 Bridges 2x10 Supine Marching  15x2   TherAct Seated marching 2x20    07/24/24 Initial evaluation of lumbar spine completed followed by instruction and trial set of HEP.                                                                                                                                 PATIENT EDUCATION:  Education details: HEP Person educated: Patient Education method: Programmer, Multimedia, Facilities Manager, Actor cues, Verbal cues, and Handouts Education comprehension: verbalized understanding, returned demonstration, and verbal cues required  HOME EXERCISE PROGRAM: Access Code: [email or web address] URL: [email or web address] Date: 07/23/2024 Prepared by: Burnard Meth  Exercises - Supine Bridge  - 2 x daily - 7 x weekly - 2 sets - 10 reps - 2 hold - Supine March  - 2 x daily - 7 x weekly - 2 sets - 10 reps - Supine Lower Trunk Rotation  - 2 x daily - 7 x weekly - 2 sets - 10 reps  ASSESSMENT:  CLINICAL IMPRESSION: Patient needed VC fo all exercises and review of HEP.  Demonstrated understanding. OBJECTIVE IMPAIRMENTS: decreased activity tolerance, decreased mobility, decreased ROM, decreased strength, and pain.   ACTIVITY LIMITATIONS: bending, sitting, standing, and squatting  PARTICIPATION LIMITATIONS: meal prep and community activity  PERSONAL FACTORS: --- are also affecting patient's functional outcome.   REHAB POTENTIAL: Good  CLINICAL DECISION MAKING:  Stable/uncomplicated  EVALUATION COMPLEXITY: Moderate   GOALS: Goals reviewed with patient? Yes  SHORT TERM GOALS: Target date: 08/14/2024    Patient to be independent with HEP.   Baseline: Goal status: INITIAL  2.  Decrease pain by 1 level. Baseline:  Goal status: INITIAL  LONG TERM GOALS: Target date: 09/18/2024  8 weeks    Patient to be independent with self progressive lumbar stabilization HEP at time of discharge. Baseline:  Goal status: INITIAL  2.  Decrease pain to max 3 out of 10 with all activities. Baseline:  Goal status: INITIAL  3.  Increase strength of lumbar stabilizers hips, gluteals,  LE by half a grade to 1 full grade by discharge. Baseline:  Goal status: INITIAL  4.  Increase range of motion by 25% or more. Baseline:  Goal status: INITIAL  PLAN:  PT FREQUENCY: 1-2x/week  PT DURATION: 8 weeks  PLANNED INTERVENTIONS: 97164- PT Re-evaluation, 97750- Physical Performance Testing, 97110-Therapeutic exercises, 97530- Therapeutic activity,  02887- Neuromuscular re-education, [phone]- Self Care, 02859- Manual therapy, [id number]- Gait training, [phone]- Aquatic Therapy, [phone]- Electrical stimulation (unattended), 97016- Vasopneumatic device, [phone] (1-2 muscles), 20561 (3+ muscles)- Dry Needling, Patient/Family education, Balance training, Spinal mobilization, Cryotherapy, and Moist heat.  PLAN FOR NEXT SESSION: increase spinal stabilization exercises.  **[employer] 10 visits 07/23/24-10/21/24**  Referring diagnosis?  M47.816 (ICD-10-CM) - Spondylosis without myelopathy or radiculopathy, lumbar region  M54.50,G89.29 (ICD-10-CM) - Chronic bilateral low back pain without sciatica  R26.9 (ICD-10-CM) - Gait disorder   Treatment diagnosis? (if different than referring diagnosis) M54.59, M54.42, G89.59 What was this (referring dx) caused by? []  Surgery []  Fall [x]  Ongoing issue []  Arthritis []  Other: ____________  Laterality: []  Rt [x]  Lt []  Both  Check all possible  CPT codes:  #97110, #02469, #02887, #02464, #02859, #02883, #02885, #02835, #    *CHOOSE 10 OR LESS*    See Planned Interventions listed in the Plan section of the Evaluation.    Burnard CHRISTELLA Meth, PT 08/06/2024, 2:29 PM  "

## 2024-08-06 ENCOUNTER — Ambulatory Visit

## 2024-08-06 DIAGNOSIS — M5459 Other low back pain: Secondary | ICD-10-CM

## 2024-08-06 DIAGNOSIS — G8929 Other chronic pain: Secondary | ICD-10-CM | POA: Diagnosis not present

## 2024-08-06 DIAGNOSIS — M5442 Lumbago with sciatica, left side: Secondary | ICD-10-CM | POA: Diagnosis not present

## 2024-08-15 ENCOUNTER — Encounter

## 2024-08-23 NOTE — Therapy (Signed)
 " OUTPATIENT PHYSICAL THERAPY THORACOLUMBAR TREATMENT   Patient Name: [name] [name] MRN: [id number] DOB:1950-08-29, 74 y.o., female Today's Date: 08/24/2024  END OF SESSION:  PT End of Session - 08/24/24 1055     Visit Number 3    Number of Visits 17    Date for Recertification  09/18/24    Authorization Time Period [employer] 10 visits 07/23/24-4/5    PT Start Time 1015    PT Stop Time 1053    PT Time Calculation (min) 38 min    Activity Tolerance Patient tolerated treatment well    Behavior During Therapy [facility] for tasks assessed/performed            Past Medical History:  Diagnosis Date   Arthritis    knee   Eczema 10/16/2014   Hypertension    Obesity    Past Surgical History:  Procedure Laterality Date   ANAL FISSURE REPAIR  1998   ARTHROSCOPIC REPAIR ACL Right 2010   DIAGNOSTIC MAMMOGRAM  2013   MYOMECTOMY  1985   TOTAL ABDOMINAL HYSTERECTOMY  1995   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   HLD (hyperlipidemia) 02/21/2020   Rash 02/20/2019   Hyperglycemia 02/20/2019   Foot joint pain 04/18/2018   Asthma 02/17/2018   CKD (chronic kidney disease) stage 3, GFR 30-59 ml/min (HCC) 02/17/2018   Peripheral neuropathy 02/18/2017   Dysuria 12/11/2015   Gout attack 12/11/2015   Dyspnea 08/14/2015   Anxiety state 08/14/2015   Allergic rhinitis 08/14/2015   Eczema 10/16/2014   Encounter for well adult exam with abnormal findings 02/12/2011   BENIGN POSITIONAL VERTIGO 02/22/2008   OBESITY 06/20/2007   Essential hypertension 06/20/2007   ANAL FISSURE, HX OF 06/20/2007   TAH/BSO, HX OF 06/20/2007    PCP: Norleen Lynwood ORN, MD   REFERRING PROVIDER: Addie Cordella Hamilton, MD   REFERRING DIAG:  [phone] (ICD-10-CM) - Spondylosis without myelopathy  or radiculopathy, lumbar region  M54.50,G89.29 (ICD-10-CM) - Chronic bilateral low back pain without sciatica  R26.9 (ICD-10-CM) - Gait disorder    MD NOTE-Short course transition to HEP - mechanical low back pain worse with standing   Rationale for Evaluation and Treatment: Rehabilitation  THERAPY DIAG:  Other low back pain  Chronic left-sided low back pain with left-sided sciatica  ONSET DATE: chronic  SUBJECTIVE:  SUBJECTIVE STATEMENT: Pt reports she has started with the HEP.  Aching in hip and knee today.  PERTINENT HISTORY:  ---  PAIN:  Are you having pain? Yes: NPRS scale: 5/10 Pain location: lumbar pain, ant L knee Pain description: sharp, radiating Aggravating factors: standing, transfers  Relieving factors: rest  PRECAUTIONS: None  RED FLAGS: None   WEIGHT BEARING RESTRICTIONS: No  FALLS:  Has patient fallen in last 6 months? No  LIVING ENVIRONMENT: Lives with: lives alone Lives in: House/apartment Stairs: No Has following equipment at home: Single point cane  OCCUPATION: retired  PLOF: Independent  PATIENT GOALS: increase mobility  NEXT MD VISIT: 11/16/24  OBJECTIVE:  Note: Objective measures were completed at Evaluation unless otherwise noted.  DIAGNOSTIC FINDINGS:  XR Lumbar 02/06/24 AP lateral radiographs lumbar spine reviewed.  End-stage arthritis is  present at L4-5 and L5-S1.  No spondylolisthesis.  Moderate degenerative  changes present throughout the lumbar spine above these levels.  No  compression fractures.  MR 03/29/24 IMPRESSION: 1. Multilevel spondylosis associated with a mild convex left scoliosis. 2. Mild multifactorial spinal stenosis at L3-4 with mild lateral recess and foraminal narrowing bilaterally. 3. Mild left lateral recess and foraminal  narrowing bilaterally at L4-5. 4. Moderate left foraminal narrowing at L5-S1 which could affect the exiting left L5 nerve root.    PATIENT SURVEYS:  PSFS: THE PATIENT SPECIFIC FUNCTIONAL SCALE  Place score of 0-10 (0 = unable to perform activity and 10 = able to perform activity at the same level as before injury or problem)  Activity Date: 07/23/24    Climbing stairs 3    2.up from toilet with no use of hands 0    3. Walking for 5 min without Cane in store 6    4.      Total Score 3      Total Score = Sum of activity scores/number of activities  Minimally Detectable Change: 3 points (for single activity); 2 points (for average score)  [employer] (nd). The Patient Specific Functional Scale . Retrieved from [email or web address]   COGNITION: Overall cognitive status: Within functional limits for tasks assessed     SENSATION: WFL  MUSCLE LENGTH: Hamstrings: Right mod restriction    POSTURE: rounded shoulders, decreased lumbar lordosis, flexed trunk , and genu valgum bil  PALPATION: 1+ tenderness L lumbar pvm  LUMBAR ROM:   AROM Eval 07/24/24  Flexion 75%  Extension 50%  Right lateral flexion 75%  Left lateral flexion 75%  Right rotation 100%  Left rotation 100%   (Blank rows = not tested)  LOWER EXTREMITY ROM:   WFL  Active  Right eval Left eval  Hip flexion    Hip extension    Hip abduction    Hip adduction    Hip internal rotation    Hip external rotation    Knee flexion    Knee extension    Ankle dorsiflexion    Ankle plantarflexion    Ankle inversion    Ankle eversion     (Blank rows = not tested)  LOWER EXTREMITY MMT:    MMT Right eval Left eval  Hip flexion 5- 4-  Hip extension    Hip abduction  4  Hip adduction  4+  Hip internal rotation    Hip external rotation    Knee flexion    Knee extension  4-  Ankle dorsiflexion  4-  Ankle plantarflexion    Ankle inversion     Ankle eversion     (  Blank rows = not tested)  LUMBAR SPECIAL TESTS:  Straight leg raise test: Negative  FUNCTIONAL TESTS:  5 times sit to stand: 22 sec   GAIT: Distance walked: short  Assistive device utilized: Single point cane Level of assistance: Complete Independence Comments: house hold no AD  TREATMENT DATE:  LBP 08/24/24 TherEx Lumbar trunk rotations 20x2 LAQ 1.5 # 2x10 Hip adduction 10x10sec  Neuro Re-Ed Supine TB hip abduction 2x15 Green Bridges 2x10 Supine Marching  15x2    TherAct Nu step level 4 5 min Seated marching 2x20 with 1.5#  08/06/24 TherEx Lumbar trunk rotations 20x2 LAQ no weight  Neuro Re-Ed Seated TB hip abduction 2x15 Bridges 2x10 Supine Marching  15x2   TherAct Seated marching 2x20    07/24/24 Initial evaluation of lumbar spine completed followed by instruction and trial set of HEP.                                                                                                                                 PATIENT EDUCATION:  Education details: HEP Person educated: Patient Education method: Programmer, Multimedia, Facilities Manager, Actor cues, Verbal cues, and Handouts Education comprehension: verbalized understanding, returned demonstration, and verbal cues required  HOME EXERCISE PROGRAM: Access Code: [email or web address] URL: [email or web address] Date: 07/23/2024 Prepared by: Burnard Meth  Exercises - Supine Bridge  - 2 x daily - 7 x weekly - 2 sets - 10 reps - 2 hold - Supine March  - 2 x daily - 7 x weekly - 2 sets - 10 reps - Supine Lower Trunk Rotation  - 2 x daily - 7 x weekly - 2 sets - 10 reps  ASSESSMENT:  CLINICAL IMPRESSION: Some initial L knee pain with brdiges but adjusted range of knee flexion and pain ceased.  OBJECTIVE IMPAIRMENTS: decreased activity tolerance, decreased mobility, decreased ROM, decreased strength, and pain.   ACTIVITY LIMITATIONS: bending, sitting, standing, and squatting  PARTICIPATION  LIMITATIONS: meal prep and community activity  PERSONAL FACTORS: --- are also affecting patient's functional outcome.   REHAB POTENTIAL: Good  CLINICAL DECISION MAKING: Stable/uncomplicated  EVALUATION COMPLEXITY: Moderate   GOALS: Goals reviewed with patient? Yes  SHORT TERM GOALS: Target date: 08/14/2024    Patient to be independent with HEP.   Baseline: Goal status: MET  2.  Decrease pain by 1 level. Baseline:  Goal status: Ongoing 08/24/24  LONG TERM GOALS: Target date: 09/18/2024  8 weeks    Patient to be independent with self progressive lumbar stabilization HEP at time of discharge. Baseline:  Goal status: INITIAL  2.  Decrease pain to max 3 out of 10 with all activities. Baseline:  Goal status: INITIAL  3.  Increase strength of lumbar stabilizers hips, gluteals,  LE by half a grade to 1 full grade by discharge. Baseline:  Goal status: INITIAL  4.  Increase range of motion by 25% or more. Baseline:  Goal status: INITIAL  PLAN:  PT FREQUENCY: 1-2x/week  PT DURATION: 8 weeks  PLANNED INTERVENTIONS: 97164- PT Re-evaluation, 97750- Physical Performance Testing, 97110-Therapeutic exercises, 97530- Therapeutic activity, [id number]- Neuromuscular re-education, 97535- Self Care, 02859- Manual therapy, [phone]- Gait training, [phone]- Aquatic Therapy, [phone]- Electrical stimulation (unattended), 97016- Vasopneumatic device, 20560 (1-2 muscles), 20561 (3+ muscles)- Dry Needling, Patient/Family education, Balance training, Spinal mobilization, Cryotherapy, and Moist heat.  PLAN FOR NEXT SESSION: increase spinal stabilization exercises.  **[employer] 10 visits 07/23/24-10/21/24**  Referring diagnosis?  M47.816 (ICD-10-CM) - Spondylosis without myelopathy or radiculopathy, lumbar region  M54.50,G89.29 (ICD-10-CM) - Chronic bilateral low back pain without sciatica  R26.9 (ICD-10-CM) - Gait disorder   Treatment diagnosis? (if different than referring diagnosis) M54.59, M54.42,  G89.59 What was this (referring dx) caused by? []  Surgery []  Fall [x]  Ongoing issue []  Arthritis []  Other: ____________  Laterality: []  Rt [x]  Lt []  Both  Check all possible CPT codes:  #97110, #02469, #02887, #02464, #02859, #02883, #02885, #02835, #    *CHOOSE 10 OR LESS*    See Planned Interventions listed in the Plan section of the Evaluation.    Burnard CHRISTELLA Meth, PT 08/24/2024, 10:57 AM  "

## 2024-08-24 ENCOUNTER — Ambulatory Visit

## 2024-08-24 DIAGNOSIS — G8929 Other chronic pain: Secondary | ICD-10-CM

## 2024-08-24 DIAGNOSIS — M5459 Other low back pain: Secondary | ICD-10-CM

## 2024-09-03 ENCOUNTER — Encounter

## 2024-09-14 ENCOUNTER — Encounter

## 2024-09-21 ENCOUNTER — Encounter

## 2024-09-28 ENCOUNTER — Encounter

## 2024-10-05 ENCOUNTER — Encounter

## 2024-11-13 ENCOUNTER — Encounter: Admitting: Family Medicine

## 2024-11-16 ENCOUNTER — Ambulatory Visit

## 2025-03-01 ENCOUNTER — Ambulatory Visit
